# Patient Record
Sex: Male | Born: 1955 | Race: White | Hispanic: No | Marital: Married | Smoking: Former smoker
Health system: Southern US, Community
[De-identification: ages and names within clinical notes are randomized; demographics above are authoritative.]

## PROBLEM LIST (undated history)

## (undated) DIAGNOSIS — J45909 Unspecified asthma, uncomplicated: Secondary | ICD-10-CM

## (undated) DIAGNOSIS — I2510 Atherosclerotic heart disease of native coronary artery without angina pectoris: Secondary | ICD-10-CM

## (undated) DIAGNOSIS — I252 Old myocardial infarction: Secondary | ICD-10-CM

## (undated) DIAGNOSIS — Z9889 Other specified postprocedural states: Secondary | ICD-10-CM

## (undated) DIAGNOSIS — E785 Hyperlipidemia, unspecified: Secondary | ICD-10-CM

## (undated) DIAGNOSIS — I10 Essential (primary) hypertension: Secondary | ICD-10-CM

## (undated) HISTORY — DX: Old myocardial infarction: I25.2

## (undated) HISTORY — DX: Essential (primary) hypertension: I10

## (undated) HISTORY — DX: Atherosclerotic heart disease of native coronary artery without angina pectoris: I25.10

## (undated) HISTORY — DX: Unspecified asthma, uncomplicated: J45.909

## (undated) HISTORY — PX: HERNIA REPAIR: SHX51

## (undated) HISTORY — DX: Hyperlipidemia, unspecified: E78.5

## (undated) HISTORY — DX: Other specified postprocedural states: Z98.890

---

## 2008-05-03 DIAGNOSIS — Z9889 Other specified postprocedural states: Secondary | ICD-10-CM

## 2008-05-03 HISTORY — DX: Other specified postprocedural states: Z98.890

## 2008-05-14 DIAGNOSIS — I252 Old myocardial infarction: Secondary | ICD-10-CM

## 2008-05-14 HISTORY — DX: Old myocardial infarction: I25.2

## 2008-05-14 HISTORY — PX: CORONARY ANGIOPLASTY WITH STENT PLACEMENT: SHX49

## 2008-05-23 ENCOUNTER — Encounter (INDEPENDENT_AMBULATORY_CARE_PROVIDER_SITE_OTHER): Payer: Self-pay | Admitting: Cardiology

## 2008-05-23 ENCOUNTER — Inpatient Hospital Stay (HOSPITAL_COMMUNITY): Admission: EM | Admit: 2008-05-23 | Discharge: 2008-05-28 | Payer: Self-pay | Admitting: Cardiology

## 2008-07-12 ENCOUNTER — Encounter (HOSPITAL_COMMUNITY): Admission: RE | Admit: 2008-07-12 | Discharge: 2008-10-10 | Payer: Self-pay | Admitting: Cardiovascular Disease

## 2008-10-11 ENCOUNTER — Encounter (HOSPITAL_COMMUNITY): Admission: RE | Admit: 2008-10-11 | Discharge: 2008-10-19 | Payer: Self-pay | Admitting: Cardiovascular Disease

## 2009-02-14 ENCOUNTER — Observation Stay (HOSPITAL_COMMUNITY): Admission: EM | Admit: 2009-02-14 | Discharge: 2009-02-15 | Payer: Self-pay | Admitting: Emergency Medicine

## 2010-04-20 LAB — CBC
Hemoglobin: 14.2 g/dL (ref 13.0–17.0)
MCHC: 34.9 g/dL (ref 30.0–36.0)
Platelets: 205 10*3/uL (ref 150–400)
Platelets: 209 10*3/uL (ref 150–400)
RBC: 4.48 MIL/uL (ref 4.22–5.81)
RDW: 12.6 % (ref 11.5–15.5)
RDW: 12.6 % (ref 11.5–15.5)
WBC: 7.2 10*3/uL (ref 4.0–10.5)

## 2010-04-20 LAB — MAGNESIUM: Magnesium: 2.3 mg/dL (ref 1.5–2.5)

## 2010-04-20 LAB — BASIC METABOLIC PANEL
BUN: 19 mg/dL (ref 6–23)
Calcium: 9.6 mg/dL (ref 8.4–10.5)
GFR calc Af Amer: >60 mL/min (ref >60)
GFR calc non Af Amer: >60 mL/min (ref >60)

## 2010-04-20 LAB — COMPREHENSIVE METABOLIC PANEL
Alkaline Phosphatase: 75 U/L (ref 39–117)
CO2: 27 mEq/L (ref 19–32)
Chloride: 102 mEq/L (ref 96–112)
GFR calc non Af Amer: >60 mL/min (ref >60)
Total Protein: 7.1 g/dL (ref 6.0–8.3)

## 2010-04-20 LAB — CARDIAC PANEL(CRET KIN+CKTOT+MB+TROPI)
Relative Index: INVALID (ref 0.0–2.5)
Total CK: 83 U/L (ref 7–232)
Troponin I: 0.04 ng/mL (ref 0.00–0.06)

## 2010-04-20 LAB — CK TOTAL AND CKMB (NOT AT ARMC)
CK, MB: 2.6 ng/mL (ref 0.3–4.0)
Relative Index: 2.1 (ref 0.0–2.5)
Total CK: 126 U/L (ref 7–232)

## 2010-04-20 LAB — DIFFERENTIAL
Eosinophils Relative: 3 % (ref 0–5)
Lymphocytes Relative: 27 % (ref 12–46)
Lymphs Abs: 2 10*3/uL (ref 0.7–4.0)
Monocytes Absolute: 0.7 10*3/uL (ref 0.1–1.0)
Monocytes Relative: 9 % (ref 3–12)

## 2010-04-20 LAB — PROTIME-INR: Prothrombin Time: 12.6 seconds (ref 11.6–15.2)

## 2010-04-20 LAB — APTT: aPTT: 30 seconds (ref 24–37)

## 2010-04-20 LAB — D-DIMER, QUANTITATIVE: D-Dimer, Quant: <0.22 ug/mL-FEU (ref 0.00–0.48)

## 2010-04-20 LAB — TROPONIN I: Troponin I: 0.02 ng/mL (ref 0.00–0.06)

## 2010-05-14 LAB — BASIC METABOLIC PANEL
BUN: 14 mg/dL (ref 6–23)
BUN: 16 mg/dL (ref 6–23)
CO2: 25 mEq/L (ref 19–32)
CO2: 26 mEq/L (ref 19–32)
Calcium: 8.7 mg/dL (ref 8.4–10.5)
Calcium: 8.8 mg/dL (ref 8.4–10.5)
Calcium: 9.5 mg/dL (ref 8.4–10.5)
Calcium: 9.7 mg/dL (ref 8.4–10.5)
Chloride: 104 mEq/L (ref 96–112)
Creatinine, Ser: 0.97 mg/dL (ref 0.4–1.5)
Creatinine, Ser: 1.02 mg/dL (ref 0.4–1.5)
Creatinine, Ser: 1.09 mg/dL (ref 0.4–1.5)
GFR calc Af Amer: >60 mL/min (ref >60)
GFR calc Af Amer: >60 mL/min (ref >60)
GFR calc Af Amer: >60 mL/min (ref >60)
GFR calc non Af Amer: >60 mL/min (ref >60)
GFR calc non Af Amer: >60 mL/min (ref >60)
GFR calc non Af Amer: >60 mL/min (ref >60)
Glucose, Bld: 102 mg/dL — ABNORMAL HIGH (ref 70–99)
Glucose, Bld: 109 mg/dL — ABNORMAL HIGH (ref 70–99)
Glucose, Bld: 144 mg/dL — ABNORMAL HIGH (ref 70–99)
Glucose, Bld: 98 mg/dL (ref 70–99)
Potassium: 3.9 mEq/L (ref 3.5–5.1)
Potassium: 4.2 mEq/L (ref 3.5–5.1)
Sodium: 128 mEq/L — ABNORMAL LOW (ref 135–145)
Sodium: 136 mEq/L (ref 135–145)
Sodium: 137 mEq/L (ref 135–145)

## 2010-05-14 LAB — CARDIAC PANEL(CRET KIN+CKTOT+MB+TROPI)
CK, MB: 16.5 ng/mL — ABNORMAL HIGH (ref 0.3–4.0)
CK, MB: 57.6 ng/mL — ABNORMAL HIGH (ref 0.3–4.0)
Relative Index: 15.5 — ABNORMAL HIGH (ref 0.0–2.5)
Relative Index: 7.9 — ABNORMAL HIGH (ref 0.0–2.5)
Total CK: 1659 U/L — ABNORMAL HIGH (ref 7–232)
Total CK: 998 U/L — ABNORMAL HIGH (ref 7–232)
Troponin I: 17.5 ng/mL (ref 0.00–0.06)

## 2010-05-14 LAB — CBC
HCT: 35.2 % — ABNORMAL LOW (ref 39.0–52.0)
HCT: 35.6 % — ABNORMAL LOW (ref 39.0–52.0)
HCT: 37.5 % — ABNORMAL LOW (ref 39.0–52.0)
HCT: 39.8 % (ref 39.0–52.0)
Hemoglobin: 12.3 g/dL — ABNORMAL LOW (ref 13.0–17.0)
Hemoglobin: 13.9 g/dL (ref 13.0–17.0)
Hemoglobin: 13.9 g/dL (ref 13.0–17.0)
MCHC: 34.9 g/dL (ref 30.0–36.0)
MCHC: 35 g/dL (ref 30.0–36.0)
MCV: 89.7 fL (ref 78.0–100.0)
MCV: 91 fL (ref 78.0–100.0)
MCV: 91.4 fL (ref 78.0–100.0)
Platelets: 183 10*3/uL (ref 150–400)
Platelets: 206 10*3/uL (ref 150–400)
Platelets: 231 10*3/uL (ref 150–400)
RBC: 4.36 MIL/uL (ref 4.22–5.81)
RBC: 4.37 MIL/uL (ref 4.22–5.81)
RDW: 11.9 % (ref 11.5–15.5)
RDW: 12.1 % (ref 11.5–15.5)
RDW: 12.4 % (ref 11.5–15.5)
RDW: 12.6 % (ref 11.5–15.5)
WBC: 16.4 10*3/uL — ABNORMAL HIGH (ref 4.0–10.5)

## 2010-05-14 LAB — LIPID PANEL
LDL Cholesterol: 95 mg/dL (ref 0–99)
Total CHOL/HDL Ratio: 4 RATIO
Triglycerides: 79 mg/dL (ref <150)
VLDL: 16 mg/dL (ref 0–40)

## 2010-05-14 LAB — DIFFERENTIAL
Basophils Absolute: 0 10*3/uL (ref 0.0–0.1)
Eosinophils Absolute: 0.2 10*3/uL (ref 0.0–0.7)
Eosinophils Relative: 1 % (ref 0–5)
Lymphocytes Relative: 9 % — ABNORMAL LOW (ref 12–46)
Lymphs Abs: 1.4 10*3/uL (ref 0.7–4.0)
Monocytes Absolute: 0.9 10*3/uL (ref 0.1–1.0)

## 2010-05-14 LAB — COMPREHENSIVE METABOLIC PANEL
CO2: 20 mEq/L (ref 19–32)
Calcium: 7.9 mg/dL — ABNORMAL LOW (ref 8.4–10.5)
Chloride: 102 mEq/L (ref 96–112)
Creatinine, Ser: 1.1 mg/dL (ref 0.4–1.5)
Sodium: 130 mEq/L — ABNORMAL LOW (ref 135–145)
Total Bilirubin: 0.5 mg/dL (ref 0.3–1.2)

## 2010-05-14 LAB — POCT I-STAT, CHEM 8
BUN: 19 mg/dL (ref 6–23)
Chloride: 106 mEq/L (ref 96–112)
Creatinine, Ser: 1 mg/dL (ref 0.4–1.5)
Glucose, Bld: 162 mg/dL — ABNORMAL HIGH (ref 70–99)

## 2010-05-14 LAB — CK TOTAL AND CKMB (NOT AT ARMC)
CK, MB: 13.4 ng/mL — ABNORMAL HIGH (ref 0.3–4.0)
Relative Index: 7.4 — ABNORMAL HIGH (ref 0.0–2.5)
Total CK: 181 U/L (ref 7–232)

## 2010-05-14 LAB — GLUCOSE, CAPILLARY: Glucose-Capillary: 130 mg/dL — ABNORMAL HIGH (ref 70–99)

## 2010-05-14 LAB — PLATELET COUNT: Platelets: 233 10*3/uL (ref 150–400)

## 2010-05-14 LAB — PROTIME-INR: INR: 6.4 (ref 0.00–1.49)

## 2010-06-17 NOTE — Cardiovascular Report (Signed)
[name], [name] NO.:  [id number]   MEDICAL RECORD NO.:  [id number]          PATIENT TYPE:  INP   LOCATION:  2807                         FACILITY:  [facility]   PHYSICIAN:  Scott Hilts. Jacinto Halim, MD       DATE OF BIRTH:  [birth year]   DATE OF PROCEDURE:  05/23/2008  DATE OF DISCHARGE:                            CARDIAC CATHETERIZATION   PROCEDURES PERFORMED:  1. Left heart catheterization including left ventriculography.  2. Selective right and left coronary arteriography.  3. Percutaneous transluminal coronary angioplasty and stenting of the      proximal left anterior descending.  4. Percutaneous transluminal coronary angioplasty and balloon      angioplasty of the diagonal branch of the left anterior descending.  5. Export thrombectomy into the mid left anterior descending.  6. Placement of intra-aortic balloon pump.  7. Defibrillation for cardiogenic shock and ventricular fibrillation      arrest.   SURGEON:  Scott Kotyk R. Jacinto Halim, MD   ASSISTANT:  Scott Batty, MD   INDICATIONS:  [name] is a 55 year old gentleman with  known hypertension who was doing well until early this morning developed  severe chest pain.  He was rushed to the emergency room and there he was  found to have ST-segment elevation myocardial infarction.  The anterior  leads suggested of anterior wall myocardial infarct.  He was also  hypotensive and in cardiogenic shock.  He was emergently rushed to the  cardiac catheter lab to evaluate his coronary anatomy.   HEMODYNAMIC DATA:  The left ventricular pressure was 68/9 with an end-  diastolic pressure of 19 mmHg.  Aortic pressure was 73/41 with a mean of  60 mmHg.  There was no pressure gradient across the aortic valve.   ANGIOGRAPHIC DATA:  Left ventricular systolic function appeared to be  mildly depressed with a mid-to-distal anteroapical hypoakinesis.   CONTRAST:  Minimal contrast was utilized because of renal insufficiency.   Right coronary artery:  Right coronary artery is codominant with  circumflex coronary artery.  It has got mild luminal irregularity.   Left main coronary artery:  The left main coronary artery is a large  caliber vessel.  It is smooth and normal.   Circumflex coronary artery:  Circumflex coronary artery is codominant  with right coronary artery.  Smooth and normal.   Ramus intermediate:  Ramus intermediate is a small caliber vessel which  is smooth and normal.   LAD:  LAD is a large caliber vessel.  There is a thrombotic 99% stenosis  noted in the proximal LAD with TIMI II flow.  Diagonal 1 branch which is  very large has an ostial 60% stenoses and also was hazy.   INTERVENTION DATA:  1. Successful PTCA and stenting of the proximal LAD with implantation      of a 3.5 x 28 mm Xience stent deployed at 10 atmospheric pressure      postdilated with a 3.5 x 12 mm Voyager [place] at 16 atmospheric      pressure with overall reduction of stenosis from 99% to 0%  and      improving the TIMI flow from TIMI II to TIMI III at the end of the      procedure.  There was much minimal amount of residual thrombus      noted in the distal LAD for which the patient underwent export      catheter thrombectomy and was left alone as it was very minimal      thrombus burden.  Bailout Integrillin was also utilized.  2. Successful PTCA and balloon angioplasty of the diagonal branch of      the LAD.  A 2.0 x 12 mm apex balloon was utilized and the stenosis      was reduced from 99% to less than 30% with brisk TIMI III to TIMI      III flow maintained at the end of the procedure.  3. Significant spasm was noted throughout the LAD with introduction of      the guidewires.  Intracoronary adenosine and nitroglycerin was also      administered.  4. The patient was defibrillated x2 for V-fib arrest.  Intra-aortic      balloon pump was utilized prior to the PCI for cardiogenic shock.   RECOMMENDATIONS:  The patient  will be continued on aggressive risk  modification.  He is presently on 5 mcg/kg/min of dopamine which will be  weaned off once his pressure is stabilized.  We will continue intra-  aortic balloon pump for 18-24 hours.  We will continue with Effient and  start him on low dose of beta-blocker and ACE inhibitors as the blood  pressure tolerates.  His serum creatinine needs to be followed closely.  His serum creatinine at baseline was 1.4.   TECHNIQUE OF PROCEDURE:  Under usual sterile precautions using a 6-  French right femoral arterial access, a 6-French Judkins left diagnostic  catheter was utilized to engage the left main coronary and angiography  was performed.   TECHNIQUE OF INTERVENTION:  The initial access was obtained by Dr.  Nanetta Rush and diagnostic angiography of the left initially was  performed by Dr. Nanetta Rush and I took over the case.  I thank him  for assisting me in starting up the case as I was on-call.   A 6-French FL-4 guide catheter was utilized to engage the left main  coronary artery.  While this was performed, Angiomax was going on.  I  accessed his left groin femoral artery and intra-aortic balloon pump was  placed.  Using Angiomax for anticoagulation, a Asahi Prowater guidewire  was advanced into the LAD.  Significant spasm was noted.  A 2.5 x 15 mm  Voyager balloon was utilized and balloon angioplasty at 16 atmospheric  pressure was performed with establishment of TIMI III flow.  We decided  to stent this with 3.5 x 28 mm Xience stent into the ostium of the LAD  followed by post-dilatation with a 3.5 x 12 mm Voyager  at 16  atmospheric pressure x2 into the LAD.  Having performed this, excellent  results with a 0% residual stenosis was noted with brisk TIMI III flow.  Because of significant stent jailing of the large diagonal 1 branch of  the LAD, I utilized the same wire and a 2.0 x 15 mm apex balloon was  utilized for balloon angioplasty with the  diagonal branch.  Multiple  balloon inflations were performed at the peak of 10 atmospheric pressure  for 1 minute each.  Having performed this, angiography revealed  excellent results.  The guidewire was withdrawn and because of presence  of minimal amount of thrombus at the distal end of the proximal LAD  stent, I re-advanced the guidewire back and export catheter was utilized  and a thrombectomy was performed.  Significant spasm was again noted.  I  gave intracoronary nitroglycerin and gently withdrew the guidewire back  and waited for 5 minutes and angiography revealed excellent brisk flow  without any distal dissection.  Minimal amount of thrombus was noted,  however, I felt comfortable that this would be washed out or dissolved  with the Integrillin and Effient and aspirin on-board along with the  continued intra-aortic balloon pump support.  Overall, the patient  tolerated the procedure well.  At the end of the procedure, his systolic  blood pressure had stabilized around 125 mmHg.   The guidewire and guide catheter was disengaged and pulled out of the  body and a 6-French Judkins right 4 diagnostic catheter was utilized to  engage the right coronary artery and angiography was performed.  The  catheter was then pulled out of the body and a 6-French angled pigtail  catheter was utilized to perform left ventriculography in RAO  projection.  Catheter was then pulled out of the body in the usual  fashion.  The patient tolerated the procedure well.  No immediate  complications were noted.      Scott Hilts. Jacinto Halim, MD  Electronically Signed     JRG/MEDQ  D:  05/23/2008  T:  05/23/2008  Job:  [id number]

## 2010-06-17 NOTE — H&P (Signed)
NAMEKC, SUMMERSON NO.:  [id number]   MEDICAL RECORD NO.:  [id number]          PATIENT TYPE:  AMB   LOCATION:  CATH                         FACILITY:  [facility]   PHYSICIAN:  Nanetta Batty, M.D.   DATE OF BIRTH:  04/28/1955   DATE OF ADMISSION:  05/23/2008  DATE OF DISCHARGE:                              HISTORY & PHYSICAL   CHIEF COMPLAINTS:  Chest pain.   HISTORY OF PRESENT ILLNESS:  The patient is a 55 year old male with no  prior history of coronary disease.  He is followed by Dr. Dareen Piano in  [place].  He has hypertension and dyslipidemia.  He is a smoker.  Apparently this morning he was at work at [employer] about 5:45 when he  developed sudden substernal chest pain, became pale and diaphoretic.  EMS was contacted.  His initial EKG showed ST elevation in V1 and V2  with ST depression in the inferior leads.  The patient was treated as a  ST elevation MI and transported urgently to [facility] cath lab directly.  En  route he became hypotensive and required IV fluids in Trendelenburg.  He  is awake, alert and oriented on arrival to the cath lab but complaining  of fairly severe substernal chest pain.  His blood pressure initially on  arrival at cath lab was 90 systolic.  It is now 120 systolic.   PAST MEDICAL HISTORY:  His past medical history is remarkable only for  treated hypertension and dyslipidemia.  He says he may have had a remote  ulcer.  He has had a prior hernia repair.   CURRENT MEDICATIONS:  His current medications include lisinopril, a  cholesterol medicine and Claritin.   ALLERGIES:  He is allergic to PENICILLIN.   SOCIAL HISTORY:  He is married, he does smoke.  Other details are  unavailable.   FAMILY HISTORY:  Family history is remarkable in that his grandmother  died apparently during some type of catheterization.  There is no  immediate family history of coronary disease.   REVIEW OF SYSTEMS:  Review of systems otherwise essentially  unremarkable  except for noted above.   PHYSICAL EXAMINATION:  VITAL SIGNS:  Blood pressure currently is 123/75,  pulse 80, respirations 18.  GENERAL:  He is a well-developed, well-nourished, anxious, pale male  complaining of substernal chest pain and moaning.  HEENT: Normocephalic aortic.  He wears glasses.  NECK:  Without JVD.  CHEST:  Chest is clear to auscultation and percussion.  CARDIAC:  Reveals regular rate and rhythm.  ABDOMEN:  Not distended, nontender.  EXTREMITIES:  Reveal no edema.  Distal pulses are diminished.  NEURO:  Exam is grossly intact.  He is awake, alert and oriented,  cooperative.  SKIN: Cool and dry currently.   IMPRESSION:  1. ST elevation myocardial infarction.  2. History of hypertension, currently hypotensive.  3. History of dyslipidemia.  4. History of smoking.  5. History of penicillin allergy.   PLAN:  The patient was taken urgently to the cath lab where he will be  cathed by Dr. Allyson Sabal.  Abelino Derrick, P.A.      Nanetta Batty, M.D.  Electronically Signed    LKK/MEDQ  D:  05/23/2008  T:  05/23/2008  Job:  [id number]

## 2010-06-17 NOTE — Discharge Summary (Signed)
[name], [name]                 ACCOUNT NO.:  [id number]   MEDICAL RECORD NO.:  [id number]          PATIENT TYPE:  INP   LOCATION:  3709                         FACILITY:  [facility]   PHYSICIAN:  Scott Hilts. Jacinto Halim, MD       DATE OF BIRTH:  May 09, 1955   DATE OF ADMISSION:  05/23/2008  DATE OF DISCHARGE:  05/28/2008                               DISCHARGE SUMMARY   DISCHARGE DIAGNOSES:  1. Acute ST elevation myocardial infarction, anterior.      a.     99% left anterior descending stenosis undergoing rescue       percutaneous transluminal coronary angioplasty and stent       deployment with a drug-eluting XIENCE stent.  2. Left ventricular dysfunction, ejection fraction 45%.  3. Ventricular fibrillation arrest x2 with procedure.  4. Acute left ventricular dysfunction with intra-aortic balloon pump      placement.  Discontinued after 24 hours.  5. Hypertension, controlled.      a.     Hypotensive with medication adjustment.  6. Dyslipidemia with change in medication.  7. Discharge condition improved.   PROCEDURES:  Combined left heart catheterization, May 23, 2008, by Dr.  Allyson Rush and Dr. Jacinto Rush with placement of XIENCE stent to the left anterior  descending emergently   DISCHARGE MEDICATIONS:  1. Effient 10 mg daily, do not stop.  2. Increase aspirin to 325 mg daily.  3. Inspra 25 mg daily.  4. Coreg 3.125 mg one twice a day.  5. Lisinopril new dose 5 mg, take only half a tablet to equal 2.5 mg      daily.  6. Protonix 40 mg daily.  7. Centrum Silver daily.  8. Tylenol Arthritis daily.  9. Crestor 20 mg one daily.  10.Zocor.  11.Neo-Synephrine nasal spray 0.25% 1-2 puffs every 4 hours for nasal      congestion as needed.  12.Nitroglycerin sublingual 1/150 for chest pain one every 5 minutes      while sitting up to total of 3/15 minutes.  13.Claritin.   DISCHARGE INSTRUCTIONS:  1. No work until after he see Dr. Allyson Rush.  2. Low-sodium heart-healthy diet.  3. Wash cath site with  soap and water.  Call if any bleeding,      swelling, or drainage both areas with balloon pump as well as      cardiac cath.  4. Increase activity slowly.  May shower.  No lifting for 2 weeks.  No      driving for 1 week.  5. Follow up with Dr. Allyson Rush in Jun 06, 2008 at 9:15 a.m. of      [facility].   HISTORY OF PRESENT ILLNESS:  A 55 year old white married male with no  prior history of coronary artery disease and is followed by Dr. Dareen Rush  at [facility] for hypertension and dyslipidemia and tobacco use, was  at work at [employer] at 5:45 when he developed sudden substernal chest  pain, became pale and diaphoretic.  EMS was called.  He was treated as  ST elevation MI, transported urgently to [facility]  Cath Lab directly.  He became hypotensive and requiring IV fluids in Trendelenburg.  He was  awake and alert on arrival to the cath lab, but has severe substernal  chest pain, pressure improved to 120 initially.  The patient had to VFib  arrest and was shocked into sinus rhythm.  Cardiac cath was done with  99% LAD stenosis, undergoing PTCA and stent deployment.   PAST MEDICAL HISTORY:  1. Hypertension and dyslipidemia.  2. Remote ulcer and prior hernia surgery.   OUTPATIENT MEDS:  Lisinopril, Zocor and Claritin.   ALLERGIES:  PENICILLIN.   FAMILY HISTORY, SOCIAL HISTORY, REVIEW OF SYSTEMS:  See H and P.   PHYSICAL EXAMINATION AT DISCHARGE:  Blood pressure 101/68, pulse 75,  sinus rhythm.  Noticed chest pain or shortness of breath.  Heart, S1,  S2, regular rate and rhythm.  Lungs are clear.  Cath sites were stable.   LABORATORY DATA:  Admitting hemoglobin 12.4, hematocrit 35.6, WBC 16.3,  platelets 206, neutrophils 85.  These remained stable at discharge,  hemoglobin 13.9, hematocrit 39.8, WBC 10.3, platelets 264.   Chemistry on admission; sodium 130, potassium 3.6, PO2 20, BUN 19,  creatinine 1.10, glucose 147.  Prior to discharge, sodium 137, potassium   3.9, chloride 104, CO2 26, BUN 12, creatinine 0.97, glucose 109.   Initial INR was 6.4 with a protime of 62.5 and PTT was 68, which was  felt to be at lab error as the patient was not on Coumadin.  He were in  other meds.  So, the initial protime INR was felt to be inaccurate, and  heparin he was therapeutic though later in the day.   AST 24, ALT 29, alkaline phos 78, total bili 0.5, and albumin 2.9.  Cardiac enzymes; initial CK 181, MB of 13.4 with a troponin of 0.42 with  increase to peak CK was 1673, MB of 185, peak troponin was 18.83 and  prior to discharge on May 25, 2008 the last, CK was 596, MB 16.5, and  troponin I is 7.69.   Total cholesterol 148, LDL 95, HDL 37, triglycerides 79, calcium 7.9 on  admission, but 9.0 on recheck, magnesium 2.4.   Lung volumes, mild edema.  Intra-aortic balloon pump was present and  followup chest x-ray, central venous pulmonary congestion improved.  No  evidence of focal infiltrate.   EKG; initial revealed ST elevations in leads V1, V2, V3 with reciprocal  changes in leads 2, 3, and aVF.  Postcath showed normal sinus rhythm,  normal EKG, minimal reciprocal changes in his inferior leads, but ST  depression was resolved.  Followup on May 24, 2008, he had  evolutionary changes of anterior MI with T-wave inversions V2, V3, and  V4.  On May 25, 2008, again sinus rhythm, normal EKG.   A 2-D echo on the right was done on May 23, 2008, LV systolic function  was 45%, moderate hypokinesis of the mid distal myocardium in the  distribution of the LAD, severe hypokinesis of the entire apical  myocardium in the LAD.   HOSPITAL COURSE:  The patient was admitted emergently from his place of  work at [employer] secondary to acute ST elevation MI and was transported to  the cath lab.  He was became hypotensive in route and fluids were given.  Dopamine was started in the cath lab.  The patient did have VFib x2 with  shocks twice as well, underwent PTCA and  stent deployment to the LAD for  99% stenosis with  improvement.  Because of the severity of the MI and  his hypotension, intra-aortic balloon pump was also placed.   Please note, his INR initially showed 6.4, this was felt to be lab  error, it was rechecked and was just slightly above normal.   By the May 24, 2008, he was doing well without chest pain or shortness  of breath.  Medications were slightly adjusted.  He continued to  improve.  Balloon pump was discontinued and the patient soon thereafter  was able to be up in the chair, tolerate being up, sit for one episode  when his blood pressure dropped somewhat after his dopamine was  discontinued and he was given a fluid bolus with improvement of his  symptoms, had some mild hyponatremia.  He continued to do well.  By  May 27, 2008, blood pressure was up and down, medications were  adjusted and on May 28, 2008, blood pressure was stable.  It was felt  he was ready for discharge home.  He will follow up in 1 week with Dr.  Allyson Rush for further interactions with medications.  He was given coupon  for his Effient and they know to call if there are any questions  concerning his care.  Please note, he was instructed to stop smoking  during the cardiac rehab as well as to the physician.      Darcella Gasman. [place], N.P.      Scott Hilts. Jacinto Halim, MD  Electronically Signed    LRI/MEDQ  D:  05/28/2008  T:  05/29/2008  Job:  [id number]   cc:   Nanetta Batty, M.D.  Dr. Dareen Rush

## 2012-07-15 ENCOUNTER — Other Ambulatory Visit: Payer: Self-pay | Admitting: *Deleted

## 2012-07-15 MED ORDER — NITROGLYCERIN 0.4 MG SL SUBL: 0.4000 mg | SUBLINGUAL_TABLET | SUBLINGUAL | Status: DC | PRN

## 2012-08-29 ENCOUNTER — Encounter: Payer: Self-pay | Admitting: Cardiology

## 2012-08-29 DIAGNOSIS — I252 Old myocardial infarction: Secondary | ICD-10-CM

## 2012-08-29 DIAGNOSIS — I2510 Atherosclerotic heart disease of native coronary artery without angina pectoris: Secondary | ICD-10-CM

## 2012-08-29 DIAGNOSIS — I10 Essential (primary) hypertension: Secondary | ICD-10-CM

## 2012-08-29 DIAGNOSIS — E785 Hyperlipidemia, unspecified: Secondary | ICD-10-CM

## 2012-08-29 DIAGNOSIS — Z9889 Other specified postprocedural states: Secondary | ICD-10-CM

## 2012-08-31 ENCOUNTER — Encounter: Payer: Self-pay | Admitting: Cardiovascular Disease

## 2012-09-01 ENCOUNTER — Ambulatory Visit (INDEPENDENT_AMBULATORY_CARE_PROVIDER_SITE_OTHER): Payer: Managed Care, Other (non HMO) | Admitting: Cardiovascular Disease

## 2012-09-01 ENCOUNTER — Encounter: Payer: Self-pay | Admitting: Cardiovascular Disease

## 2012-09-01 VITALS — BP 110/82 | HR 59 | Ht 68.5 in | Wt 191.0 lb

## 2012-09-01 DIAGNOSIS — E785 Hyperlipidemia, unspecified: Secondary | ICD-10-CM

## 2012-09-01 DIAGNOSIS — Z79899 Other long term (current) drug therapy: Secondary | ICD-10-CM

## 2012-09-01 DIAGNOSIS — I2510 Atherosclerotic heart disease of native coronary artery without angina pectoris: Secondary | ICD-10-CM

## 2012-09-01 LAB — HEPATIC FUNCTION PANEL
AST: 20 U/L (ref 0–37)
Alkaline Phosphatase: 81 U/L (ref 39–117)
Bilirubin, Direct: 0.2 mg/dL (ref 0.0–0.3)
Indirect Bilirubin: 0.5 mg/dL (ref 0.0–0.9)
Total Bilirubin: 0.7 mg/dL (ref 0.3–1.2)

## 2012-09-01 LAB — LIPID PANEL: Total CHOL/HDL Ratio: 3 Ratio

## 2012-09-01 NOTE — Progress Notes (Signed)
09/01/2012 [name]   11/18/1955  [id number]  Primary Physician No PCP Per Patient Primary Cardiologist: [name] [name]   HPI:  The patient is a very pleasant 57 year old, mildly overweight, married Caucasian male, father of 3 living children (1 son was killed in [name] July 22, 2004, by a sniper,) grandfather to 4 grandchildren and 4 step-grandchildren who I last saw in the office May 19, 2010. He suffered an anterior wall myocardial infarction May 14, 2008, complicated by ventricular fibrillation requiring defibrillation. He had cardiogenic shock requiring intraaortic balloon pumping. He was stented with a 3.5 x 20 mm long Xience drug-eluting stent. His EF at that time was 45% with anteroapical wall motion abnormality. Since that time his diet has changed, he stopped smoking and he exercises. His EF improved and followup Myoview performed several months later showed significant salvage of myocardium with no evidence of scar. His last lipid profile performed10/5/13 regular cholesterol of 122, LDL 69 and HDL of 36. He denies chest pain or shortness of breath.     Current Outpatient Prescriptions  Medication Sig Dispense Refill  . acetaminophen (TYLENOL) 650 MG CR tablet Take 650 mg by mouth every 8 (eight) hours as needed for pain.      Marland Kitchen acyclovir (ZOVIRAX) 400 MG tablet Take 400 mg by mouth 3 (three) times daily as needed.      Marland Kitchen albuterol (PROVENTIL HFA;VENTOLIN HFA) 108 (90 BASE) MCG/ACT inhaler Inhale 2 puffs into the lungs every 6 (six) hours as needed for wheezing.      Marland Kitchen aspirin 325 MG tablet Take 325 mg by mouth daily.      Marland Kitchen atorvastatin (LIPITOR) 40 MG tablet Take 40 mg by mouth daily.      . carvedilol (COREG) 3.125 MG tablet Take 3.125 mg by mouth 2 (two) times daily with a meal.      . cetirizine (ZYRTEC) 10 MG tablet Take 10 mg by mouth daily.      . clopidogrel (PLAVIX) 75 MG tablet Take 75 mg by mouth daily.      Marland Kitchen eplerenone (INSPRA) 25 MG  tablet Take 25 mg by mouth daily.      Marland Kitchen lisinopril (PRINIVIL,ZESTRIL) 2.5 MG tablet Take 2.5 mg by mouth daily.      . Multiple Vitamin (MULTIVITAMIN) tablet Take 1 tablet by mouth daily.      . nitroGLYCERIN (NITROSTAT) 0.4 MG SL tablet Place 1 tablet (0.4 mg total) under the tongue every 5 (five) minutes as needed for chest pain.  25 tablet  1  . pantoprazole (PROTONIX) 40 MG tablet Take 40 mg by mouth daily.       No current facility-administered medications for this visit.    Allergies  Allergen Reactions  . Penicillins     History   Social History  . Marital Status: Single    Spouse Name: N/A    Number of Children: N/A  . Years of Education: N/A   Occupational History  . Not on file.   Social History Main Topics  . Smoking status: Former Smoker    Quit date: 05/23/2008  . Smokeless tobacco: Not on file  . Alcohol Use: Not on file  . Drug Use: Not on file  . Sexually Active: Not on file   Other Topics Concern  . Not on file   Social History Narrative  . No narrative on file     Review of Systems: General: negative for chills, fever, night sweats or  weight changes.  Cardiovascular: negative for chest pain, dyspnea on exertion, edema, orthopnea, palpitations, paroxysmal nocturnal dyspnea or shortness of breath Dermatological: negative for rash Respiratory: negative for cough or wheezing Urologic: negative for hematuria Abdominal: negative for nausea, vomiting, diarrhea, bright red blood per rectum, melena, or hematemesis Neurologic: negative for visual changes, syncope, or dizziness All other systems reviewed and are otherwise negative except as noted above.    Blood pressure 110/82, pulse 59, height 5' 8.5" (1.74 m), weight 191 lb (86.637 kg).  General appearance: alert and no distress Neck: no adenopathy, no carotid bruit, no JVD, supple, symmetrical, trachea midline and thyroid not enlarged, symmetric, no tenderness/mass/nodules Lungs: clear to  auscultation bilaterally Heart: regular rate and rhythm, S1, S2 normal, no murmur, click, rub or gallop Extremities: extremities normal, atraumatic, no cyanosis or edema  EKG sinus bradycardia at 59 without ST or T wave changes  ASSESSMENT AND PLAN:   CAD (coronary artery disease), native coronary artery Status post trauma cranial function 05/14/2008 complicated by ventricular fibrillation requiring defibrillation. He had cardiogenic shock requiring intra-aortic balloon pumping. He was stented with a 3.5 x 20 mm long drug-eluting stent. His EF at that time was 45% with anterolateral apical wall motion and a malady which has subsequently improved by 2-D echo. In my view also showed no evidence of scar and no ischemia on 07/27/08. He did stop smoking and change his diet. He also exercises. He denies chest pain or shortness of breath.  Hyperlipidemia LDL goal <70 On statin drugs with his last lipid profile performed 11/08/11 revealed a total cholesterol of 122, LDL 69 and HDL of 36      [name] [facility], [facility] 09/01/2012 10:18 AM

## 2012-09-01 NOTE — Patient Instructions (Addendum)
Your physician wants you to follow-up in:  12 months.  You will receive a reminder letter in the mail two months in advance. If you don't receive a letter, please call our office to schedule the follow-up appointment.   

## 2012-09-01 NOTE — Assessment & Plan Note (Signed)
Status post trauma cranial function 05/14/2008 complicated by ventricular fibrillation requiring defibrillation. He had cardiogenic shock requiring intra-aortic balloon pumping. He was stented with a 3.5 x 20 mm long drug-eluting stent. His EF at that time was 45% with anterolateral apical wall motion and a malady which has subsequently improved by 2-D echo. In my view also showed no evidence of scar and no ischemia on 07/27/08. He did stop smoking and change his diet. He also exercises. He denies chest pain or shortness of breath.

## 2012-09-01 NOTE — Assessment & Plan Note (Signed)
On statin drugs with his last lipid profile performed 11/08/11 revealed a total cholesterol of 122, LDL 69 and HDL of 36

## 2012-09-05 ENCOUNTER — Encounter: Payer: Self-pay | Admitting: *Deleted

## 2012-10-24 ENCOUNTER — Other Ambulatory Visit: Payer: Self-pay | Admitting: *Deleted

## 2012-10-24 MED ORDER — PANTOPRAZOLE SODIUM 40 MG PO TBEC: 40.0000 mg | DELAYED_RELEASE_TABLET | Freq: Every day | ORAL | Status: DC

## 2012-10-24 MED ORDER — CARVEDILOL 3.125 MG PO TABS: 3.1250 mg | ORAL_TABLET | Freq: Two times a day (BID) | ORAL | Status: DC

## 2012-10-24 MED ORDER — EPLERENONE 25 MG PO TABS: 25.0000 mg | ORAL_TABLET | Freq: Every day | ORAL | Status: DC

## 2012-10-24 MED ORDER — LISINOPRIL 2.5 MG PO TABS: 2.5000 mg | ORAL_TABLET | Freq: Every day | ORAL | Status: DC

## 2012-10-24 MED ORDER — ATORVASTATIN CALCIUM 40 MG PO TABS: 40.0000 mg | ORAL_TABLET | Freq: Every day | ORAL | Status: DC

## 2012-10-24 MED ORDER — CLOPIDOGREL BISULFATE 75 MG PO TABS: 75.0000 mg | ORAL_TABLET | Freq: Every day | ORAL | Status: DC

## 2012-10-31 NOTE — Telephone Encounter (Signed)
Called [facility]: Inspra 25mg ,Coreg 3.125mg , Lisinopril 2.5mg , and Atorvastatin 40mg  have all been filled and shipped out.

## 2013-01-19 ENCOUNTER — Other Ambulatory Visit: Payer: Self-pay

## 2013-01-19 NOTE — Telephone Encounter (Signed)
Rx refused

## 2013-02-24 ENCOUNTER — Emergency Department (HOSPITAL_COMMUNITY): Payer: BC Managed Care – PPO

## 2013-02-24 ENCOUNTER — Encounter (HOSPITAL_COMMUNITY): Payer: Self-pay | Admitting: Emergency Medicine

## 2013-02-24 ENCOUNTER — Observation Stay (HOSPITAL_COMMUNITY)
Admission: EM | Admit: 2013-02-24 | Discharge: 2013-02-25 | Disposition: A | Discharge status: Home / Self Care | Payer: BC Managed Care – PPO | Attending: Cardiology | Admitting: Cardiology

## 2013-02-24 DIAGNOSIS — I2510 Atherosclerotic heart disease of native coronary artery without angina pectoris: Secondary | ICD-10-CM | POA: Insufficient documentation

## 2013-02-24 DIAGNOSIS — R0789 Other chest pain: Principal | ICD-10-CM | POA: Insufficient documentation

## 2013-02-24 DIAGNOSIS — Z7982 Long term (current) use of aspirin: Secondary | ICD-10-CM | POA: Insufficient documentation

## 2013-02-24 DIAGNOSIS — I252 Old myocardial infarction: Secondary | ICD-10-CM

## 2013-02-24 DIAGNOSIS — E785 Hyperlipidemia, unspecified: Secondary | ICD-10-CM | POA: Insufficient documentation

## 2013-02-24 DIAGNOSIS — Z9861 Coronary angioplasty status: Secondary | ICD-10-CM | POA: Insufficient documentation

## 2013-02-24 DIAGNOSIS — Z7902 Long term (current) use of antithrombotics/antiplatelets: Secondary | ICD-10-CM | POA: Insufficient documentation

## 2013-02-24 DIAGNOSIS — I10 Essential (primary) hypertension: Secondary | ICD-10-CM | POA: Insufficient documentation

## 2013-02-24 DIAGNOSIS — Z880 Allergy status to penicillin: Secondary | ICD-10-CM | POA: Insufficient documentation

## 2013-02-24 DIAGNOSIS — J45909 Unspecified asthma, uncomplicated: Secondary | ICD-10-CM | POA: Insufficient documentation

## 2013-02-24 DIAGNOSIS — R079 Chest pain, unspecified: Secondary | ICD-10-CM

## 2013-02-24 DIAGNOSIS — Z79899 Other long term (current) drug therapy: Secondary | ICD-10-CM | POA: Insufficient documentation

## 2013-02-24 DIAGNOSIS — Z87891 Personal history of nicotine dependence: Secondary | ICD-10-CM | POA: Insufficient documentation

## 2013-02-24 LAB — BASIC METABOLIC PANEL
BUN: 14 mg/dL (ref 6–23)
BUN: 14 mg/dL (ref 6–23)
CALCIUM: 10 mg/dL (ref 8.4–10.5)
CO2: 24 mEq/L (ref 19–32)
CO2: 24 mEq/L (ref 19–32)
Calcium: 10 mg/dL (ref 8.4–10.5)
Chloride: 102 mEq/L (ref 96–112)
Chloride: 105 mEq/L (ref 96–112)
Creatinine, Ser: 0.98 mg/dL (ref 0.50–1.35)
Creatinine, Ser: 0.84 mg/dL (ref 0.50–1.35)
GFR calc Af Amer: >90 mL/min (ref >90)
GFR calc non Af Amer: 90 mL/min — ABNORMAL LOW (ref >90)
GLUCOSE: 102 mg/dL — AB (ref 70–99)
GLUCOSE: 91 mg/dL (ref 70–99)
POTASSIUM: 4 meq/L (ref 3.7–5.3)
POTASSIUM: 4.1 meq/L (ref 3.7–5.3)
Sodium: 141 mEq/L (ref 137–147)
Sodium: 144 mEq/L (ref 137–147)

## 2013-02-24 LAB — CBC
HEMATOCRIT: 41.8 % (ref 39.0–52.0)
HEMOGLOBIN: 14.9 g/dL (ref 13.0–17.0)
MCH: 31 pg (ref 26.0–34.0)
MCHC: 35.6 g/dL (ref 30.0–36.0)
MCV: 86.9 fL (ref 78.0–100.0)
Platelets: 257 10*3/uL (ref 150–400)
RBC: 4.81 MIL/uL (ref 4.22–5.81)
RDW: 12.4 % (ref 11.5–15.5)
WBC: 7.5 10*3/uL (ref 4.0–10.5)

## 2013-02-24 LAB — CBC WITH DIFFERENTIAL/PLATELET
Basophils Absolute: 0 10*3/uL (ref 0.0–0.1)
Basophils Relative: 1 % (ref 0–1)
EOS ABS: 0.2 10*3/uL (ref 0.0–0.7)
EOS PCT: 2 % (ref 0–5)
HCT: 39.8 % (ref 39.0–52.0)
Hemoglobin: 14 g/dL (ref 13.0–17.0)
Lymphocytes Relative: 27 % (ref 12–46)
Lymphs Abs: 2.2 10*3/uL (ref 0.7–4.0)
MCH: 30.7 pg (ref 26.0–34.0)
MCHC: 35.2 g/dL (ref 30.0–36.0)
MCV: 87.3 fL (ref 78.0–100.0)
Monocytes Absolute: 0.6 10*3/uL (ref 0.1–1.0)
Monocytes Relative: 7 % (ref 3–12)
NEUTROS PCT: 63 % (ref 43–77)
Neutro Abs: 5 10*3/uL (ref 1.7–7.7)
PLATELETS: 261 10*3/uL (ref 150–400)
RBC: 4.56 MIL/uL (ref 4.22–5.81)
RDW: 12.5 % (ref 11.5–15.5)
WBC: 8 10*3/uL (ref 4.0–10.5)

## 2013-02-24 LAB — APTT: aPTT: 30 seconds (ref 24–37)

## 2013-02-24 LAB — TROPONIN I
Troponin I: <0.3 ng/mL (ref <0.30)
Troponin I: <0.3 ng/mL (ref <0.30)

## 2013-02-24 LAB — TSH: TSH: 1.22 u[IU]/mL (ref 0.350–4.500)

## 2013-02-24 LAB — PROTIME-INR
INR: 0.98 (ref 0.00–1.49)
PROTHROMBIN TIME: 12.8 s (ref 11.6–15.2)

## 2013-02-24 LAB — POCT I-STAT TROPONIN I: Troponin i, poc: 0 ng/mL (ref 0.00–0.08)

## 2013-02-24 LAB — PRO B NATRIURETIC PEPTIDE: Pro B Natriuretic peptide (BNP): 25.9 pg/mL (ref 0–125)

## 2013-02-24 MED ORDER — PANTOPRAZOLE SODIUM 40 MG PO TBEC
40.0000 mg | DELAYED_RELEASE_TABLET | Freq: Every day | ORAL | Status: DC
  Administered 2013-02-25: 40 mg via ORAL
  Filled 2013-02-24: qty 1

## 2013-02-24 MED ORDER — NITROGLYCERIN 0.4 MG SL SUBL: 0.4000 mg | SUBLINGUAL_TABLET | SUBLINGUAL | Status: DC | PRN

## 2013-02-24 MED ORDER — SODIUM CHLORIDE 0.9 % IJ SOLN
3.0000 mL | Freq: Two times a day (BID) | INTRAMUSCULAR | Status: DC
  Administered 2013-02-24: 3 mL via INTRAVENOUS

## 2013-02-24 MED ORDER — SODIUM CHLORIDE 0.9 % IV SOLN: 250.0000 mL | INTRAVENOUS | Status: DC | PRN

## 2013-02-24 MED ORDER — CARVEDILOL 3.125 MG PO TABS
3.1250 mg | ORAL_TABLET | Freq: Two times a day (BID) | ORAL | Status: DC
  Administered 2013-02-24 – 2013-02-25 (×2): 3.125 mg via ORAL
  Filled 2013-02-24 (×4): qty 1

## 2013-02-24 MED ORDER — ONDANSETRON HCL 4 MG/2ML IJ SOLN: 4.0000 mg | Freq: Four times a day (QID) | INTRAMUSCULAR | Status: DC | PRN

## 2013-02-24 MED ORDER — ASPIRIN 300 MG RE SUPP
300.0000 mg | RECTAL | Status: DC
  Filled 2013-02-24: qty 1

## 2013-02-24 MED ORDER — ASPIRIN 325 MG PO TABS
325.0000 mg | ORAL_TABLET | Freq: Every day | ORAL | Status: DC
  Administered 2013-02-25: 325 mg via ORAL
  Filled 2013-02-24: qty 1

## 2013-02-24 MED ORDER — LISINOPRIL 2.5 MG PO TABS
2.5000 mg | ORAL_TABLET | Freq: Every day | ORAL | Status: DC
  Administered 2013-02-25: 2.5 mg via ORAL
  Filled 2013-02-24: qty 1

## 2013-02-24 MED ORDER — ALBUTEROL SULFATE (2.5 MG/3ML) 0.083% IN NEBU: 3.0000 mL | INHALATION_SOLUTION | Freq: Four times a day (QID) | RESPIRATORY_TRACT | Status: DC | PRN

## 2013-02-24 MED ORDER — OXYMETAZOLINE HCL 0.05 % NA SOLN: 1.0000 | Freq: Four times a day (QID) | NASAL | Status: DC | PRN

## 2013-02-24 MED ORDER — ACETAMINOPHEN 325 MG PO TABS: 650.0000 mg | ORAL_TABLET | ORAL | Status: DC | PRN

## 2013-02-24 MED ORDER — HEPARIN SODIUM (PORCINE) 5000 UNIT/ML IJ SOLN
5000.0000 [IU] | Freq: Three times a day (TID) | INTRAMUSCULAR | Status: DC
  Administered 2013-02-24: 5000 [IU] via SUBCUTANEOUS
  Filled 2013-02-24 (×5): qty 1

## 2013-02-24 MED ORDER — ASPIRIN 81 MG PO CHEW
324.0000 mg | CHEWABLE_TABLET | Freq: Once | ORAL | Status: AC
  Administered 2013-02-24: 324 mg via ORAL
  Filled 2013-02-24: qty 4

## 2013-02-24 MED ORDER — ASPIRIN 81 MG PO CHEW: 324.0000 mg | CHEWABLE_TABLET | ORAL | Status: DC

## 2013-02-24 MED ORDER — SODIUM CHLORIDE 0.9 % IJ SOLN: 3.0000 mL | INTRAMUSCULAR | Status: DC | PRN

## 2013-02-24 MED ORDER — LORATADINE 10 MG PO TABS
10.0000 mg | ORAL_TABLET | Freq: Every day | ORAL | Status: DC
  Administered 2013-02-25: 10 mg via ORAL
  Filled 2013-02-24: qty 1

## 2013-02-24 MED ORDER — ATORVASTATIN CALCIUM 40 MG PO TABS
40.0000 mg | ORAL_TABLET | Freq: Every day | ORAL | Status: DC
  Administered 2013-02-24 – 2013-02-25 (×2): 40 mg via ORAL
  Filled 2013-02-24 (×2): qty 1

## 2013-02-24 MED ORDER — CLOPIDOGREL BISULFATE 75 MG PO TABS
75.0000 mg | ORAL_TABLET | Freq: Every day | ORAL | Status: DC
  Administered 2013-02-25: 75 mg via ORAL
  Filled 2013-02-24: qty 1

## 2013-02-24 NOTE — ED Provider Notes (Signed)
CSN: [id number]     Arrival date & time 02/24/13  1016 History   First MD Initiated Contact with Patient 02/24/13 1023     Chief Complaint  Patient presents with  . Chest Pain   (Consider location/radiation/quality/duration/timing/severity/associated sxs/prior Treatment) HPI Comments: 58 yo male with hx of STEMI in 2010 (followed by Dr. Allyson SabalBerry) now with multiple episodes of left sided, sharp chest pain. Almost axillary. No SOB, fever or N/V. Never had pain like this before. Started shortly after exertion while lifting boxes. Pain resolved on its own. Describes pain as intermittent every few minutes for about 30 minutes. No numbness, radiation or back pain. Pain resolved PTA. Never took NTG.   Past Medical History  Diagnosis Date  . History of acute anterior wall MI 05/14/2008    wth v. fib & defibrillation, cardiogenic shock wth IABP,, EMERGENCY STENTING  . H/O cardiac catheterization 05/2008    Xience DES to LAD, EF by Echo 08/08/08 >55%  . Hyperlipidemia LDL goal <70   . CAD (coronary artery disease), native coronary artery   . HTN (hypertension)   . Asthma    Past Surgical History  Procedure Laterality Date  . Coronary angioplasty with stent placement  05/14/08    Xience DES to LAD-emergently  . Hernia repair  1980, 06/13/07   Family History  Problem Relation Age of Onset  . Hypertension Mother   . Hyperlipidemia Mother   . Diabetes Father   . Hyperlipidemia Sister   . Hypertension Sister    History  Substance Use Topics  . Smoking status: Former Smoker    Quit date: 05/23/2008  . Smokeless tobacco: Not on file  . Alcohol Use: Not on file    Review of Systems  Constitutional: Negative for fever, diaphoresis and fatigue.  Respiratory: Negative for cough and shortness of breath.   Cardiovascular: Positive for chest pain. Negative for leg swelling.  Gastrointestinal: Negative for nausea, vomiting and abdominal pain.  Neurological: Negative for weakness.  All other systems  reviewed and are negative.    Allergies  Penicillins  Home Medications   Current Outpatient Rx  Name  Route  Sig  Dispense  Refill  . acetaminophen (TYLENOL) 650 MG CR tablet   Oral   Take 650 mg by mouth every 8 (eight) hours as needed for pain.         Marland Kitchen. acyclovir (ZOVIRAX) 400 MG tablet   Oral   Take 400 mg by mouth 3 (three) times daily as needed.         Marland Kitchen. albuterol (PROVENTIL HFA;VENTOLIN HFA) 108 (90 BASE) MCG/ACT inhaler   Inhalation   Inhale 2 puffs into the lungs every 6 (six) hours as needed for wheezing.         Marland Kitchen. aspirin 325 MG tablet   Oral   Take 325 mg by mouth daily.         Marland Kitchen. atorvastatin (LIPITOR) 40 MG tablet   Oral   Take 1 tablet (40 mg total) by mouth daily.   90 tablet   3   . carvedilol (COREG) 3.125 MG tablet   Oral   Take 1 tablet (3.125 mg total) by mouth 2 (two) times daily with a meal.   180 tablet   3   . cetirizine (ZYRTEC) 10 MG tablet   Oral   Take 10 mg by mouth daily.         . clopidogrel (PLAVIX) 75 MG tablet   Oral   Take 1  tablet (75 mg total) by mouth daily.   90 tablet   3   . eplerenone (INSPRA) 25 MG tablet   Oral   Take 1 tablet (25 mg total) by mouth daily.   90 tablet   3   . lisinopril (PRINIVIL,ZESTRIL) 2.5 MG tablet   Oral   Take 1 tablet (2.5 mg total) by mouth daily.   90 tablet   3   . Multiple Vitamin (MULTIVITAMIN) tablet   Oral   Take 1 tablet by mouth daily.         . nitroGLYCERIN (NITROSTAT) 0.4 MG SL tablet   Sublingual   Place 1 tablet (0.4 mg total) under the tongue every 5 (five) minutes as needed for chest pain.   25 tablet   1   . oxymetazoline (AFRIN) 0.05 % nasal spray   Each Nare   Place 1 spray into both nostrils 4 (four) times daily as needed for congestion.         . pantoprazole (PROTONIX) 40 MG tablet   Oral   Take 1 tablet (40 mg total) by mouth daily.   90 tablet   3    BP 138/98  Pulse 74  Temp(Src) 98 F (36.7 C) (Oral)  Resp 22  Ht 5\' 8"   (1.727 m)  Wt 186 lb (84.369 kg)  BMI 28.29 kg/m2  SpO2 100% Physical Exam  Nursing note and vitals reviewed. Constitutional: He is oriented to person, place, and time. He appears well-developed and well-nourished. No distress.  HENT:  Head: Normocephalic and atraumatic.  Right Ear: External ear normal.  Left Ear: External ear normal.  Nose: Nose normal.  Eyes: Right eye exhibits no discharge. Left eye exhibits no discharge.  Neck: Neck supple.  Cardiovascular: Normal rate, regular rhythm, normal heart sounds and intact distal pulses.   Pulmonary/Chest: Effort normal and breath sounds normal. He exhibits no tenderness.  Abdominal: Soft. There is no tenderness.  Musculoskeletal: He exhibits no edema.  Neurological: He is alert and oriented to person, place, and time.  Skin: Skin is warm and dry.    ED Course  Procedures (including critical care time) Labs Review Labs Reviewed  BASIC METABOLIC PANEL - Abnormal; Notable for the following:    Glucose, Bld 102 (*)    GFR calc non Af Amer 90 (*)    All other components within normal limits  CBC  TROPONIN I  PROTIME-INR  APTT  BASIC METABOLIC PANEL  PRO B NATRIURETIC PEPTIDE  CBC WITH DIFFERENTIAL  TROPONIN I  TROPONIN I  TSH  CBC  POCT I-STAT TROPONIN I   Imaging Review Dg Chest 2 View  02/24/2013   CLINICAL DATA:  Left side chest pain, weakness  EXAM: CHEST  2 VIEW  COMPARISON:  02/14/2009  FINDINGS: Cardiomediastinal silhouette is stable. No acute infiltrate or pleural effusion. No pulmonary edema. Stable calcified granuloma left base laterally. Mild degenerative changes thoracic spine.  IMPRESSION: No active cardiopulmonary disease.   Electronically Signed   By: Natasha Mead M.D.   On: 02/24/2013 11:23    EKG Interpretation    Date/Time:  Friday February 24 2013 10:20:40 EST Ventricular Rate:  73 PR Interval:  160 QRS Duration: 72 QT Interval:  370 QTC Calculation: 407 R Axis:   30 Text Interpretation:  Normal sinus  rhythm Cannot rule out Anterior infarct , age undetermined Abnormal ECG No significant change since last tracing Confirmed by Monterius Rolf  MD, Mako Pelfrey ([id number]) on 02/24/2013 10:24:16 AM  MDM   1. Chest pain    Patient's CP is atypical. Resolved prior to arrival. Given ASA here. Could be atypical ACS. Not high enough suspicion to start anticoagulants at this time given resolved sx. However, could be atypical, so will admit to cards for r/o.    Audree Camel, MD 02/24/13 2107

## 2013-02-24 NOTE — ED Notes (Signed)
Heart healthy diet ordered. 

## 2013-02-24 NOTE — ED Notes (Signed)
Pt reports left sided chest pain that started this morning about 0930. Not currently having pain, but has a previous hx of MI. Denies any N/V, SOB or diaphoresis. Cardiologist is Dr. Gery PrayBarry. Skin warm and dry. Reports he took 325 ASA this morning PTA.

## 2013-02-24 NOTE — H&P (Signed)
Patient ID: [name] MRN: [id number], DOB/AGE: [birth year]   Admit date: 02/24/2013   Primary Physician: No PCP Per Patient Primary Cardiologist: Dr. Allyson Sabal  Pt. Profile:  58 year old male who comes to the emergency room because of chest pain earlier today  Problem List  Past Medical History  Diagnosis Date  . History of acute anterior wall MI 05/14/2008    wth v. fib & defibrillation, cardiogenic shock wth IABP,, EMERGENCY STENTING  . H/O cardiac catheterization 05/2008    Xience DES to LAD, EF by Echo 08/08/08 >55%  . Hyperlipidemia LDL goal <70   . CAD (coronary artery disease), native coronary artery   . HTN (hypertension)   . Asthma     Past Surgical History  Procedure Laterality Date  . Coronary angioplasty with stent placement  05/14/08    Xience DES to LAD-emergently  . Hernia repair  1980, 06/13/07     Allergies  Allergies  Allergen Reactions  . Penicillins Hives and Swelling    HPI  This 58 year old gentleman was at work today and developed left chest pain.  He works as a Arboriculturist at [employer].  He had moved some furniture and he had lifted some boxes of books.  He subsequently noted the onset of several left lateral anterior chest pains which were described as quick and shooting and they were occurring at 5 minute intervals.  He had 4 or 5 of them and became concerned and had one of the associate ministers drive him to the emergency room.  The patient did not have any radiation of the pain down his arm.  There was no nausea or diaphoresis.  The pain was not worse with deep breathing.  He felt the urge to defecate and had a bowel movement prior to coming to the hospital. His past history reveals that in April 2010 he presented with an acute anterior wall myocardial infarction.  During the heart catheterization he had ventricular fibrillation and developed shock with intra-aortic balloon pump having to be placed.  He has a Xience DES stent  in his LAD placed on 05/14/08.  The patient had a treadmill Myoview stress test on 07/27/08.  He exercised for 11 minutes.  There was no evidence of any ischemia and his ejection fraction was 65% with no significant wall motion abnormalities noted.  His exercise capacity was 12 METS. The patient has not been getting any regular intentional aerobic exercise.  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  He has not been experiencing any recent exertional chest discomfort or dyspnea.  He states that he would prefer not to have another treadmill stress test because the last test was so difficult for him even though he did well on the test.  Home Medications  Prior to Admission medications   Medication Sig Start Date End Date Taking? Authorizing Provider  acetaminophen (TYLENOL) 650 MG CR tablet Take 650 mg by mouth every 8 (eight) hours as needed for pain.   Yes Historical Provider, MD  acyclovir (ZOVIRAX) 400 MG tablet Take 400 mg by mouth 3 (three) times daily as needed.   Yes Historical Provider, MD  albuterol (PROVENTIL HFA;VENTOLIN HFA) 108 (90 BASE) MCG/ACT inhaler Inhale 2 puffs into the lungs every 6 (six) hours as needed for wheezing.   Yes Historical Provider, MD  aspirin 325 MG tablet Take 325 mg by mouth daily.  Yes Historical Provider, MD  atorvastatin (LIPITOR) 40 MG tablet Take 1 tablet (40 mg total) by mouth daily. 10/24/12  Yes Runell Gess, MD  carvedilol (COREG) 3.125 MG tablet Take 1 tablet (3.125 mg total) by mouth 2 (two) times daily with a meal. 10/24/12  Yes Runell Gess, MD  cetirizine (ZYRTEC) 10 MG tablet Take 10 mg by mouth daily.   Yes Historical Provider, MD  clopidogrel (PLAVIX) 75 MG tablet Take 1 tablet (75 mg total) by mouth daily. 10/24/12  Yes Runell Gess, MD  eplerenone (INSPRA) 25 MG tablet Take 1 tablet (25 mg total) by mouth daily. 10/24/12  Yes Runell Gess, MD  lisinopril  (PRINIVIL,ZESTRIL) 2.5 MG tablet Take 1 tablet (2.5 mg total) by mouth daily. 10/24/12  Yes Runell Gess, MD  Multiple Vitamin (MULTIVITAMIN) tablet Take 1 tablet by mouth daily.   Yes Historical Provider, MD  nitroGLYCERIN (NITROSTAT) 0.4 MG SL tablet Place 1 tablet (0.4 mg total) under the tongue every 5 (five) minutes as needed for chest pain. 07/15/12  Yes Runell Gess, MD  oxymetazoline (AFRIN) 0.05 % nasal spray Place 1 spray into both nostrils 4 (four) times daily as needed for congestion.   Yes Historical Provider, MD  pantoprazole (PROTONIX) 40 MG tablet Take 1 tablet (40 mg total) by mouth daily. 10/24/12  Yes Runell Gess, MD    Family History  Family History  Problem Relation Age of Onset  . Hypertension Mother   . Hyperlipidemia Mother   . Diabetes Father   . Hyperlipidemia Sister   . Hypertension Sister     Social History  History   Social History  . Marital Status: Single    Spouse Name: N/A    Number of Children: N/A  . Years of Education: N/A   Occupational History  . Not on file.   Social History Main Topics  . Smoking status: Former Smoker    Quit date: 05/23/2008  . Smokeless tobacco: Not on file  . Alcohol Use: Not on file  . Drug Use: Not on file  . Sexual Activity: Not on file   Other Topics Concern  . Not on file   Social History Narrative  . No narrative on file     Review of Systems General:  No chills, fever, night sweats or weight changes.  Cardiovascular:  No chest pain, dyspnea on exertion, edema, orthopnea, palpitations, paroxysmal nocturnal dyspnea. Dermatological: No rash, lesions/masses Respiratory: No cough, dyspnea Urologic: No hematuria, dysuria Abdominal:   No nausea, vomiting, diarrhea, bright red blood per rectum, melena, or hematemesis Neurologic:  No visual changes, wkns, changes in mental status. All other systems reviewed and are otherwise negative except as noted above.  Physical Exam  Blood pressure  138/98, pulse 74, temperature 98 F (36.7 C), temperature source Oral, resp. rate 22, height 5\' 8"  (1.727 m), weight 186 lb (84.369 kg), SpO2 100.00%.  General: Pleasant, NAD.  Denies any chest pain since arriving at the hospital. Psych: Normal affect. Neuro: Alert and oriented X 3. Moves all extremities spontaneously. HEENT: Normal  Neck: Supple without bruits or JVD. Lungs:  Resp regular and unlabored, CTA.  No chest wall tenderness. Heart: RRR no s3, s4, or murmurs. Abdomen: Soft, non-tender, non-distended, BS + x 4.  Extremities: No clubbing, cyanosis or edema. DP/PT/Radials 2+ and equal bilaterally.  Labs  Troponin [facility] of Care Test)  Recent Labs  02/24/13 1100  TROPIPOC 0.00   No results found for this  basename: CKTOTAL, CKMB, TROPONINI,  in the last 72 hours Lab Results  Component Value Date   WBC 7.5 02/24/2013   HGB 14.9 02/24/2013   HCT 41.8 02/24/2013   MCV 86.9 02/24/2013   PLT 257 02/24/2013     Recent Labs Lab 02/24/13 1050  NA 141  K 4.0  CL 102  CO2 24  BUN 14  CREATININE 0.98  CALCIUM 10.0  GLUCOSE 102*   Lab Results  Component Value Date   CHOL 128 09/01/2012   HDL 42 09/01/2012   LDLCALC 63 09/01/2012   TRIG 115 09/01/2012   Lab Results  Component Value Date   DDIMER  Value: <0.22        AT THE INHOUSE ESTABLISHED CUTOFF VALUE OF 0.48 ug/mL FEU, THIS ASSAY HAS BEEN DOCUMENTED IN THE LITERATURE TO HAVE A SENSITIVITY AND NEGATIVE PREDICTIVE VALUE OF AT LEAST 98 TO 99%.  THE TEST RESULT SHOULD BE CORRELATED WITH AN ASSESSMENT OF THE CLINICAL PROBABILITY OF DVT / VTE. 02/14/2009     Radiology/Studies  Dg Chest 2 View  02/24/2013   CLINICAL DATA:  Left side chest pain, weakness  EXAM: CHEST  2 VIEW  COMPARISON:  02/14/2009  FINDINGS: Cardiomediastinal silhouette is stable. No acute infiltrate or pleural effusion. No pulmonary edema. Stable calcified granuloma left base laterally. Mild degenerative changes thoracic spine.  IMPRESSION: No active  cardiopulmonary disease.   Electronically Signed   By: Natasha MeadLiviu  Pop M.D.   On: 02/24/2013 11:23    ECG Normal sinus rhythm Cannot rule out Anterior infarct , age undetermined Abnormal ECG No significant change since last tracing Confirmed by GOLDSTON MD, SCOTT   ASSESSMENT AND PLAN  1 atypical chest pain.  Heart score of 3 for age and prior history of myocardial infarction and multiple risk factors. 2. Hypertension 3. Hypercholesterolemia 4. mild exogenous obesity  Plan: Admit for observation.  Serial cardiac enzymes.  Will get echocardiogram today.  Repeat EKG in a.m.  If he rules out and has no further symptoms he can probably be safely discharged in a.m. and followup as an outpatient with Dr. Allyson SabalBerry Signed, Cassell Clementhomas Atif Chapple, MD  02/24/2013, 12:15 PMf

## 2013-02-25 DIAGNOSIS — I10 Essential (primary) hypertension: Secondary | ICD-10-CM

## 2013-02-25 DIAGNOSIS — I2510 Atherosclerotic heart disease of native coronary artery without angina pectoris: Secondary | ICD-10-CM

## 2013-02-25 DIAGNOSIS — E785 Hyperlipidemia, unspecified: Secondary | ICD-10-CM

## 2013-02-25 DIAGNOSIS — I059 Rheumatic mitral valve disease, unspecified: Secondary | ICD-10-CM

## 2013-02-25 LAB — TROPONIN I

## 2013-02-25 MED ORDER — ASPIRIN EC 81 MG PO TBEC: 81.0000 mg | DELAYED_RELEASE_TABLET | Freq: Every day | ORAL | Status: DC

## 2013-02-25 NOTE — Progress Notes (Signed)
SUBJECTIVE:  No further complaints of CP  OBJECTIVE:   Vitals:   Filed Vitals:   02/24/13 1430 02/24/13 1529 02/24/13 2028 02/25/13 0450  BP:  127/81 113/74 113/71  Pulse: 72  69 67  Temp:  98.3 F (36.8 C) 98.3 F (36.8 C) 98 F (36.7 C)  TempSrc:   Oral Oral  Resp: 13  14 16   Height:      Weight:    188 lb 11.4 oz (85.6 kg)  SpO2: 97% 97% 97% 96%   I&O's:  No intake or output data in the 24 hours ending 02/25/13 1002 TELEMETRY: Reviewed telemetry pt in NSR:     PHYSICAL EXAM General: Well developed, well nourished, in no acute distress Head: Eyes PERRLA, No xanthomas.   Normal cephalic and atramatic  Lungs:   Clear bilaterally to auscultation and percussion. Heart:   HRRR S1 S2 Pulses are 2+ & equal. Abdomen: Bowel sounds are positive, abdomen soft and non-tender without masses  Extremities:   No clubbing, cyanosis or edema.  DP +1 Neuro: Alert and oriented X 3. Psych:  Good affect, responds appropriately   LABS: Basic Metabolic Panel:  Recent Labs  16/11/9599/23/15 1050 02/24/13 1602  NA 141 144  K 4.0 4.1  CL 102 105  CO2 24 24  GLUCOSE 102* 91  BUN 14 14  CREATININE 0.98 0.84  CALCIUM 10.0 10.0   Liver Function Tests: No results found for this basename: AST, ALT, ALKPHOS, BILITOT, PROT, ALBUMIN,  in the last 72 hours No results found for this basename: LIPASE, AMYLASE,  in the last 72 hours CBC:  Recent Labs  02/24/13 1050 02/24/13 1602  WBC 7.5 8.0  NEUTROABS  --  5.0  HGB 14.9 14.0  HCT 41.8 39.8  MCV 86.9 87.3  PLT 257 261   Cardiac Enzymes:  Recent Labs  02/24/13 1602 02/24/13 2200 02/25/13 0451  TROPONINI <0.30 <0.30 <0.30   BNP: No components found with this basename: POCBNP,  D-Dimer: No results found for this basename: DDIMER,  in the last 72 hours Hemoglobin A1C: No results found for this basename: HGBA1C,  in the last 72 hours Fasting Lipid Panel: No results found for this basename: CHOL, HDL, LDLCALC, TRIG, CHOLHDL,  LDLDIRECT,  in the last 72 hours Thyroid Function Tests:  Recent Labs  02/24/13 1602  TSH 1.220   Anemia Panel: No results found for this basename: VITAMINB12, FOLATE, FERRITIN, TIBC, IRON, RETICCTPCT,  in the last 72 hours Coag Panel:   Lab Results  Component Value Date   INR 0.98 02/24/2013   INR 0.95 02/14/2009   INR 1.7* 05/23/2008    RADIOLOGY: Dg Chest 2 View  02/24/2013   CLINICAL DATA:  Left side chest pain, weakness  EXAM: CHEST  2 VIEW  COMPARISON:  02/14/2009  FINDINGS: Cardiomediastinal silhouette is stable. No acute infiltrate or pleural effusion. No pulmonary edema. Stable calcified granuloma left base laterally. Mild degenerative changes thoracic spine.  IMPRESSION: No active cardiopulmonary disease.   Electronically Signed   By: Natasha MeadLiviu  Pop M.D.   On: 02/24/2013 11:23    ASSESSMENT AND PLAN  1 atypical chest pain. Heart score of 3 for age and prior history of myocardial infarction and multiple risk factors.  Troponins negative x 3 and BNP normal.  No further CP.  2D echo with normal LVF EF 55% with no RWMA's and grade I diastolic dysfunction.  He has been up ambulating with no further CP.  EKG this am normal. 2. Hypertension -  controlled 3. Hypercholesterolemia  4. mild exogenous obesity   Plan:  Discharge home today with early followup as an outpatient with Dr. Allyson Sabal next week Continue ASA/statin/Plavix/beta blocker/ACE I   Quintella Reichert, MD  02/25/2013  10:02 AM

## 2013-02-25 NOTE — Progress Notes (Signed)
Echo Lab  2D Echocardiogram completed.  Shianne Zeiser L Jadore Veals, RDCS 02/25/2013 9:29 AM

## 2013-02-25 NOTE — Discharge Instructions (Signed)
Chest Pain (Nonspecific) °It is often hard to give a specific diagnosis for the cause of chest pain. There is always a chance that your pain could be related to something serious, such as a heart attack or a blood clot in the lungs. You need to follow up with your caregiver for further evaluation. °CAUSES  °· Heartburn. °· Pneumonia or bronchitis. °· Anxiety or stress. °· Inflammation around your heart (pericarditis) or lung (pleuritis or pleurisy). °· A blood clot in the lung. °· A collapsed lung (pneumothorax). It can develop suddenly on its own (spontaneous pneumothorax) or from injury (trauma) to the chest. °· Shingles infection (herpes zoster virus). °The chest wall is composed of bones, muscles, and cartilage. Any of these can be the source of the pain. °· The bones can be bruised by injury. °· The muscles or cartilage can be strained by coughing or overwork. °· The cartilage can be affected by inflammation and become sore (costochondritis). °DIAGNOSIS  °Lab tests or other studies, such as X-rays, electrocardiography, stress testing, or cardiac imaging, may be needed to find the cause of your pain.  °TREATMENT  °· Treatment depends on what may be causing your chest pain. Treatment may include: °· Acid blockers for heartburn. °· Anti-inflammatory medicine. °· Pain medicine for inflammatory conditions. °· Antibiotics if an infection is present. °· You may be advised to change lifestyle habits. This includes stopping smoking and avoiding alcohol, caffeine, and chocolate. °· You may be advised to keep your head raised (elevated) when sleeping. This reduces the chance of acid going backward from your stomach into your esophagus. °· Most of the time, nonspecific chest pain will improve within 2 to 3 days with rest and mild pain medicine. °HOME CARE INSTRUCTIONS  °· If antibiotics were prescribed, take your antibiotics as directed. Finish them even if you start to feel better. °· For the next few days, avoid physical  activities that bring on chest pain. Continue physical activities as directed. °· Do not smoke. °· Avoid drinking alcohol. °· Only take over-the-counter or prescription medicine for pain, discomfort, or fever as directed by your caregiver. °· Follow your caregiver's suggestions for further testing if your chest pain does not go away. °· Keep any follow-up appointments you made. If you do not go to an appointment, you could develop lasting (chronic) problems with pain. If there is any problem keeping an appointment, you must call to reschedule. °SEEK MEDICAL CARE IF:  °· You think you are having problems from the medicine you are taking. Read your medicine instructions carefully. °· Your chest pain does not go away, even after treatment. °· You develop a rash with blisters on your chest. °SEEK IMMEDIATE MEDICAL CARE IF:  °· You have increased chest pain or pain that spreads to your arm, neck, jaw, back, or abdomen. °· You develop shortness of breath, an increasing cough, or you are coughing up blood. °· You have severe back or abdominal pain, feel nauseous, or vomit. °· You develop severe weakness, fainting, or chills. °· You have a fever. °THIS IS AN EMERGENCY. Do not wait to see if the pain will go away. Get medical help at once. Call your local emergency services (911 in [place] Do not drive yourself to the hospital. °MAKE SURE YOU:  °· Understand these instructions. °· Will watch your condition. °· Will get help right away if you are not doing well or get worse. °Document Released: 10/29/2004 Document Revised: 04/13/2011 Document Reviewed: 08/25/2007 °[employer]® Patient Information ©2014 [employer]   [employer]. ° °

## 2013-02-25 NOTE — Discharge Summary (Signed)
Patient ID: [name],  MRN: [id number], DOB/AGE: 04/04/1955 58 y.o.  Admit date: 02/24/2013 Discharge date: 02/25/2013  Primary Care Provider:  Primary Cardiologist: Dr Gwenlyn Found  Discharge Diagnoses Active Problems:   Chest pain with moderate risk of acute coronary syndrome   CAD - LAD DES 2010   History of acute anterior wall MI   Hyperlipidemia LDL goal <70      Hospital Course: 58 y/o male who presented in April 2010 he presented with an acute anterior wall myocardial infarction. During the heart catheterization he had ventricular fibrillation and developed shock with intra-aortic balloon pump having to be placed. He has a Xience DES stent in his LAD placed on 05/14/08. The patient had a treadmill Myoview stress test on 07/27/08. There was no evidence of any ischemia and his ejection fraction was 65% with no significant wall motion abnormalities noted.            He presented 02/24/13 with chest pain after moving some books in boxes at his [employer]. He ruled out for an MI. An echo was essentially normal. He has had no further pain and Dr Radford Pax feels he can be discharged. We will arrange office follow up in 2 weeks.   Discharge Vitals:  Blood pressure 113/78, pulse 67, temperature 98 F (36.7 C), temperature source Oral, resp. rate 16, height 5' 8" (1.727 m), weight 188 lb 11.4 oz (85.6 kg), SpO2 96.00%.    Labs: Results for orders placed during the hospital encounter of 02/24/13 (from the past 48 hour(s))  CBC     Status: None   Collection Time    02/24/13 10:50 AM      Result Value Range   WBC 7.5  4.0 - 10.5 K/uL   RBC 4.81  4.22 - 5.81 MIL/uL   Hemoglobin 14.9  13.0 - 17.0 g/dL   HCT 41.8  39.0 - 52.0 %   MCV 86.9  78.0 - 100.0 fL   MCH 31.0  26.0 - 34.0 pg   MCHC 35.6  30.0 - 36.0 g/dL   RDW 12.4  11.5 - 15.5 %   Platelets 257  150 - 400 K/uL  BASIC METABOLIC PANEL     Status: Abnormal   Collection Time    02/24/13 10:50 AM      Result Value Range   Sodium 141  137 -  147 mEq/L   Potassium 4.0  3.7 - 5.3 mEq/L   Chloride 102  96 - 112 mEq/L   CO2 24  19 - 32 mEq/L   Glucose, Bld 102 (*) 70 - 99 mg/dL   BUN 14  6 - 23 mg/dL   Creatinine, Ser 0.98  0.50 - 1.35 mg/dL   Calcium 10.0  8.4 - 10.5 mg/dL   GFR calc non Af Amer 90 (*) >90 mL/min   GFR calc Af Amer >90  >90 mL/min   Comment: (NOTE)     The eGFR has been calculated using the CKD EPI equation.     This calculation has not been validated in all clinical situations.     eGFR's persistently <90 mL/min signify possible Chronic Kidney     Disease.  POCT I-STAT TROPONIN I     Status: None   Collection Time    02/24/13 11:00 AM      Result Value Range   Troponin i, poc 0.00  0.00 - 0.08 ng/mL   Comment 3            Comment:  Due to the release kinetics of cTnI,     a negative result within the first hours     of the onset of symptoms does not rule out     myocardial infarction with certainty.     If myocardial infarction is still suspected,     repeat the test at appropriate intervals.  TROPONIN I     Status: None   Collection Time    02/24/13  4:02 PM      Result Value Range   Troponin I <0.30  <0.30 ng/mL   Comment:            Due to the release kinetics of cTnI,     a negative result within the first hours     of the onset of symptoms does not rule out     myocardial infarction with certainty.     If myocardial infarction is still suspected,     repeat the test at appropriate intervals.  PROTIME-INR     Status: None   Collection Time    02/24/13  4:02 PM      Result Value Range   Prothrombin Time 12.8  11.6 - 15.2 seconds   INR 0.98  0.00 - 1.49  APTT     Status: None   Collection Time    02/24/13  4:02 PM      Result Value Range   aPTT 30  24 - 37 seconds  BASIC METABOLIC PANEL     Status: None   Collection Time    02/24/13  4:02 PM      Result Value Range   Sodium 144  137 - 147 mEq/L   Potassium 4.1  3.7 - 5.3 mEq/L   Chloride 105  96 - 112 mEq/L   CO2 24  19 - 32 mEq/L    Glucose, Bld 91  70 - 99 mg/dL   BUN 14  6 - 23 mg/dL   Creatinine, Ser 0.84  0.50 - 1.35 mg/dL   Calcium 10.0  8.4 - 10.5 mg/dL   GFR calc non Af Amer >90  >90 mL/min   GFR calc Af Amer >90  >90 mL/min   Comment: (NOTE)     The eGFR has been calculated using the CKD EPI equation.     This calculation has not been validated in all clinical situations.     eGFR's persistently <90 mL/min signify possible Chronic Kidney     Disease.  PRO B NATRIURETIC PEPTIDE     Status: None   Collection Time    02/24/13  4:02 PM      Result Value Range   Pro B Natriuretic peptide (BNP) 25.9  0 - 125 pg/mL  CBC WITH DIFFERENTIAL     Status: None   Collection Time    02/24/13  4:02 PM      Result Value Range   WBC 8.0  4.0 - 10.5 K/uL   RBC 4.56  4.22 - 5.81 MIL/uL   Hemoglobin 14.0  13.0 - 17.0 g/dL   HCT 39.8  39.0 - 52.0 %   MCV 87.3  78.0 - 100.0 fL   MCH 30.7  26.0 - 34.0 pg   MCHC 35.2  30.0 - 36.0 g/dL   RDW 12.5  11.5 - 15.5 %   Platelets 261  150 - 400 K/uL   Neutrophils Relative % 63  43 - 77 %   Neutro Abs 5.0  1.7 - 7.7 K/uL   Lymphocytes  Relative 27  12 - 46 %   Lymphs Abs 2.2  0.7 - 4.0 K/uL   Monocytes Relative 7  3 - 12 %   Monocytes Absolute 0.6  0.1 - 1.0 K/uL   Eosinophils Relative 2  0 - 5 %   Eosinophils Absolute 0.2  0.0 - 0.7 K/uL   Basophils Relative 1  0 - 1 %   Basophils Absolute 0.0  0.0 - 0.1 K/uL  TSH     Status: None   Collection Time    02/24/13  4:02 PM      Result Value Range   TSH 1.220  0.350 - 4.500 uIU/mL   Comment: Performed at [facility]  TROPONIN I     Status: None   Collection Time    02/24/13 10:00 PM      Result Value Range   Troponin I <0.30  <0.30 ng/mL   Comment:            Due to the release kinetics of cTnI,     a negative result within the first hours     of the onset of symptoms does not rule out     myocardial infarction with certainty.     If myocardial infarction is still suspected,     repeat the test at  appropriate intervals.  TROPONIN I     Status: None   Collection Time    02/25/13  4:51 AM      Result Value Range   Troponin I <0.30  <0.30 ng/mL   Comment:            Due to the release kinetics of cTnI,     a negative result within the first hours     of the onset of symptoms does not rule out     myocardial infarction with certainty.     If myocardial infarction is still suspected,     repeat the test at appropriate intervals.    Disposition:      Follow-up Information   Follow up with Kerin Ransom K, PA-C. (office will call you)    Specialty:  Cardiology   Contact information:   [address] [place] [address] [phone]       Discharge Medications:    Medication List    STOP taking these medications       aspirin 325 MG tablet  Replaced by:  aspirin EC 81 MG tablet      TAKE these medications       acetaminophen 650 MG CR tablet  Commonly known as:  TYLENOL  Take 650 mg by mouth every 8 (eight) hours as needed for pain.     acyclovir 400 MG tablet  Commonly known as:  ZOVIRAX  Take 400 mg by mouth 3 (three) times daily as needed.     albuterol 108 (90 BASE) MCG/ACT inhaler  Commonly known as:  PROVENTIL HFA;VENTOLIN HFA  Inhale 2 puffs into the lungs every 6 (six) hours as needed for wheezing.     aspirin EC 81 MG tablet  Take 1 tablet (81 mg total) by mouth daily.     atorvastatin 40 MG tablet  Commonly known as:  LIPITOR  Take 1 tablet (40 mg total) by mouth daily.     carvedilol 3.125 MG tablet  Commonly known as:  COREG  Take 1 tablet (3.125 mg total) by mouth 2 (two) times daily with a meal.     cetirizine 10 MG  tablet  Commonly known as:  ZYRTEC  Take 10 mg by mouth daily.     clopidogrel 75 MG tablet  Commonly known as:  PLAVIX  Take 1 tablet (75 mg total) by mouth daily.     eplerenone 25 MG tablet  Commonly known as:  INSPRA  Take 1 tablet (25 mg total) by mouth daily.     lisinopril 2.5 MG tablet  Commonly  known as:  PRINIVIL,ZESTRIL  Take 1 tablet (2.5 mg total) by mouth daily.     multivitamin tablet  Take 1 tablet by mouth daily.     nitroGLYCERIN 0.4 MG SL tablet  Commonly known as:  NITROSTAT  Place 1 tablet (0.4 mg total) under the tongue every 5 (five) minutes as needed for chest pain.     oxymetazoline 0.05 % nasal spray  Commonly known as:  AFRIN  Place 1 spray into both nostrils 4 (four) times daily as needed for congestion.     pantoprazole 40 MG tablet  Commonly known as:  PROTONIX  Take 1 tablet (40 mg total) by mouth daily.         Duration of Discharge Encounter: Greater than 30 minutes including physician time.  Angelena Form PA-C 02/25/2013 11:17 AM

## 2013-07-18 ENCOUNTER — Telehealth: Payer: Self-pay | Admitting: Cardiovascular Disease

## 2013-07-21 NOTE — Telephone Encounter (Signed)
Closed encounter °

## 2013-09-04 ENCOUNTER — Ambulatory Visit (INDEPENDENT_AMBULATORY_CARE_PROVIDER_SITE_OTHER): Payer: 59 | Admitting: Cardiovascular Disease

## 2013-09-04 ENCOUNTER — Encounter: Payer: Self-pay | Admitting: Cardiovascular Disease

## 2013-09-04 VITALS — BP 120/88 | HR 70 | Ht 68.5 in | Wt 197.0 lb

## 2013-09-04 DIAGNOSIS — I2510 Atherosclerotic heart disease of native coronary artery without angina pectoris: Secondary | ICD-10-CM

## 2013-09-04 DIAGNOSIS — I10 Essential (primary) hypertension: Secondary | ICD-10-CM

## 2013-09-04 DIAGNOSIS — E785 Hyperlipidemia, unspecified: Secondary | ICD-10-CM

## 2013-09-04 MED ORDER — ATORVASTATIN CALCIUM 40 MG PO TABS: 40.0000 mg | ORAL_TABLET | Freq: Every day | ORAL | Status: DC

## 2013-09-04 MED ORDER — LISINOPRIL 2.5 MG PO TABS: 2.5000 mg | ORAL_TABLET | Freq: Every day | ORAL | Status: DC

## 2013-09-04 MED ORDER — EPLERENONE 25 MG PO TABS: 25.0000 mg | ORAL_TABLET | Freq: Every day | ORAL | Status: DC

## 2013-09-04 MED ORDER — CARVEDILOL 3.125 MG PO TABS: 3.1250 mg | ORAL_TABLET | Freq: Two times a day (BID) | ORAL | Status: DC

## 2013-09-04 MED ORDER — NITROGLYCERIN 0.4 MG SL SUBL: 0.4000 mg | SUBLINGUAL_TABLET | SUBLINGUAL | Status: DC | PRN

## 2013-09-04 MED ORDER — CLOPIDOGREL BISULFATE 75 MG PO TABS: 75.0000 mg | ORAL_TABLET | Freq: Every day | ORAL | Status: DC

## 2013-09-04 MED ORDER — PANTOPRAZOLE SODIUM 40 MG PO TBEC: 40.0000 mg | DELAYED_RELEASE_TABLET | Freq: Every day | ORAL | Status: DC

## 2013-09-04 NOTE — Progress Notes (Signed)
09/04/2013 [name]   1955/04/13  [id number]  Primary Physician Devra Dopp, MD Primary Cardiologist: Runell Gess MD [name]   HPI:  The patient is a very pleasant 58 year old, mildly overweight, married Caucasian male, father of 3 living children (1 son was killed in [place] July 22, 2004, by a sniper,) grandfather to 4 grandchildren and 4 step-grandchildren who I last saw in the office July of 2014. He suffered an anterior wall myocardial infarction May 14, 2008, complicated by ventricular fibrillation requiring defibrillation. He had cardiogenic shock requiring intraaortic balloon pumping. He was stented with a 3.5 x 20 mm long Xience drug-eluting stent. His EF at that time was 45% with anteroapical wall motion abnormality. Since that time his diet has changed, he stopped smoking and he exercises. His EF improved and followup Myoview performed several months later showed significant salvage of myocardium with no evidence of scar. His last lipid profile performed 06/21/13 revealed a total cholesterol of 144, LDL 71 and HDL of 54. He denies chest pain or shortness of breath.     Current Outpatient Prescriptions  Medication Sig Dispense Refill  . acetaminophen (TYLENOL) 650 MG CR tablet Take 650 mg by mouth every 8 (eight) hours as needed for pain.      Marland Kitchen acyclovir (ZOVIRAX) 400 MG tablet Take 400 mg by mouth 3 (three) times daily as needed.      Marland Kitchen albuterol (PROVENTIL HFA;VENTOLIN HFA) 108 (90 BASE) MCG/ACT inhaler Inhale 2 puffs into the lungs every 6 (six) hours as needed for wheezing.      Marland Kitchen aspirin EC 81 MG tablet Take 162 mg by mouth daily.      Marland Kitchen atorvastatin (LIPITOR) 40 MG tablet Take 1 tablet (40 mg total) by mouth daily.  90 tablet  3  . carvedilol (COREG) 3.125 MG tablet Take 1 tablet (3.125 mg total) by mouth 2 (two) times daily with a meal.  180 tablet  3  . cetirizine (ZYRTEC) 10 MG tablet Take 10 mg by mouth daily.      . clopidogrel (PLAVIX) 75  MG tablet Take 1 tablet (75 mg total) by mouth daily.  90 tablet  3  . Coenzyme Q10 (CO Q-10 MAXIMUM STRENGTH PO) Take 250 mg by mouth daily.      Marland Kitchen eplerenone (INSPRA) 25 MG tablet Take 1 tablet (25 mg total) by mouth daily.  90 tablet  3  . fluticasone (FLONASE) 50 MCG/ACT nasal spray Place 2 sprays into the nose as needed.       Marland Kitchen lisinopril (PRINIVIL,ZESTRIL) 2.5 MG tablet Take 1 tablet (2.5 mg total) by mouth daily.  90 tablet  3  . Multiple Vitamin (MULTIVITAMIN) tablet Take 1 tablet by mouth daily.      . nitroGLYCERIN (NITROSTAT) 0.4 MG SL tablet Place 1 tablet (0.4 mg total) under the tongue every 5 (five) minutes as needed for chest pain.  25 tablet  1  . oxymetazoline (AFRIN) 0.05 % nasal spray Place 1 spray into both nostrils 4 (four) times daily as needed for congestion.      . pantoprazole (PROTONIX) 40 MG tablet Take 1 tablet (40 mg total) by mouth daily.  90 tablet  3   No current facility-administered medications for this visit.    Allergies  Allergen Reactions  . Demeclocycline Hives  . Penicillins Hives and Swelling    History   Social History  . Marital Status: Married    Spouse Name: N/A  Number of Children: N/A  . Years of Education: N/A   Occupational History  . Not on file.   Social History Main Topics  . Smoking status: Former Smoker    Quit date: 05/23/2008  . Smokeless tobacco: Not on file  . Alcohol Use: Not on file  . Drug Use: Not on file  . Sexual Activity: Not on file   Other Topics Concern  . Not on file   Social History Narrative  . No narrative on file     Review of Systems: General: negative for chills, fever, night sweats or weight changes.  Cardiovascular: negative for chest pain, dyspnea on exertion, edema, orthopnea, palpitations, paroxysmal nocturnal dyspnea or shortness of breath Dermatological: negative for rash Respiratory: negative for cough or wheezing Urologic: negative for hematuria Abdominal: negative for nausea,  vomiting, diarrhea, bright red blood per rectum, melena, or hematemesis Neurologic: negative for visual changes, syncope, or dizziness All other systems reviewed and are otherwise negative except as noted above.    Blood pressure 120/88, pulse 70, height 5' 8.5" (1.74 m), weight 197 lb (89.359 kg).  General appearance: alert and no distress Neck: no adenopathy, no carotid bruit, no JVD, supple, symmetrical, trachea midline and thyroid not enlarged, symmetric, no tenderness/mass/nodules Lungs: clear to auscultation bilaterally Heart: regular rate and rhythm, S1, S2 normal, no murmur, click, rub or gallop Extremities: extremities normal, atraumatic, no cyanosis or edema  EKG normal sinus rhythm at 70 without ST or T wave changes  ASSESSMENT AND PLAN:   CAD - LAD DES 2010 History of CAD status post acute anterior wall myocardial infarction 05/14/08, located by ventricular fibrillation requiring defibrillation. He had cardiogenic shock requiring intra-aortic balloon pumping. I stented his LAD with a 3.5 x 20 balloon long drug-eluting stent. His EF was 45% of the time and hasn't improved by 2-D echo. He denies chest pain or shortness of breath.  HTN (hypertension) Controlled on current medications  Hyperlipidemia LDL goal <70 On statin therapy with recent lipid profile performed 06/2513 revealing a total cholesterol 144, LDL 71, HDL 54 and triglycerides of 94      Runell GessJonathan J. Tomasita Beevers MD [facility] CodFACP,FACC,FAHA, [facility] Carolina[facility]SCAI 09/04/2013 4:41 PM

## 2013-09-04 NOTE — Assessment & Plan Note (Signed)
History of CAD status post acute anterior wall myocardial infarction 05/14/08, located by ventricular fibrillation requiring defibrillation. He had cardiogenic shock requiring intra-aortic balloon pumping. I stented his LAD with a 3.5 x 20 balloon long drug-eluting stent. His EF was 45% of the time and hasn't improved by 2-D echo. He denies chest pain or shortness of breath.

## 2013-09-04 NOTE — Patient Instructions (Signed)
Your physician wants you to follow-up in: 1 year with Dr Berry. You will receive a reminder letter in the mail two months in advance. If you don't receive a letter, please call our office to schedule the follow-up appointment.  

## 2013-09-04 NOTE — Assessment & Plan Note (Signed)
On statin therapy with recent lipid profile performed 06/2513 revealing a total cholesterol 144, LDL 71, HDL 54 and triglycerides of 94

## 2013-09-04 NOTE — Assessment & Plan Note (Signed)
Controlled on current medications 

## 2013-09-06 ENCOUNTER — Telehealth: Payer: Self-pay | Admitting: Cardiovascular Disease

## 2013-09-06 MED ORDER — CARVEDILOL 3.125 MG PO TABS: 3.1250 mg | ORAL_TABLET | Freq: Two times a day (BID) | ORAL | Status: DC

## 2013-09-06 MED ORDER — NITROGLYCERIN 0.4 MG SL SUBL: 0.4000 mg | SUBLINGUAL_TABLET | SUBLINGUAL | Status: DC | PRN

## 2013-09-06 MED ORDER — CLOPIDOGREL BISULFATE 75 MG PO TABS: 75.0000 mg | ORAL_TABLET | Freq: Every day | ORAL | Status: DC

## 2013-09-06 MED ORDER — ATORVASTATIN CALCIUM 40 MG PO TABS: 40.0000 mg | ORAL_TABLET | Freq: Every day | ORAL | Status: DC

## 2013-09-06 MED ORDER — PANTOPRAZOLE SODIUM 40 MG PO TBEC: 40.0000 mg | DELAYED_RELEASE_TABLET | Freq: Every day | ORAL | Status: DC

## 2013-09-06 MED ORDER — EPLERENONE 25 MG PO TABS: 25.0000 mg | ORAL_TABLET | Freq: Every day | ORAL | Status: DC

## 2013-09-06 MED ORDER — LISINOPRIL 2.5 MG PO TABS: 2.5000 mg | ORAL_TABLET | Freq: Every day | ORAL | Status: DC

## 2013-09-06 NOTE — Telephone Encounter (Signed)
Pt wants to talk to a nurse about his medicine,said it was called into the wrong place. He said it was too much for me to take down,he would explain everything to the nurse.

## 2013-09-06 NOTE — Telephone Encounter (Signed)
[facility]  Sent in error to [facility] at visit.

## 2014-08-28 ENCOUNTER — Ambulatory Visit: Payer: Managed Care, Other (non HMO) | Admitting: Cardiovascular Disease

## 2014-09-04 ENCOUNTER — Encounter: Payer: Self-pay | Admitting: Cardiovascular Disease

## 2014-09-04 ENCOUNTER — Ambulatory Visit (INDEPENDENT_AMBULATORY_CARE_PROVIDER_SITE_OTHER): Payer: 59 | Admitting: Cardiovascular Disease

## 2014-09-04 VITALS — BP 120/80 | HR 74 | Ht 68.5 in | Wt 200.0 lb

## 2014-09-04 DIAGNOSIS — I10 Essential (primary) hypertension: Secondary | ICD-10-CM

## 2014-09-04 DIAGNOSIS — Z79899 Other long term (current) drug therapy: Secondary | ICD-10-CM

## 2014-09-04 DIAGNOSIS — I2510 Atherosclerotic heart disease of native coronary artery without angina pectoris: Secondary | ICD-10-CM | POA: Diagnosis not present

## 2014-09-04 DIAGNOSIS — E785 Hyperlipidemia, unspecified: Secondary | ICD-10-CM | POA: Diagnosis not present

## 2014-09-04 LAB — HEPATIC FUNCTION PANEL
ALT: 33 U/L (ref 9–46)
AST: 23 U/L (ref 10–35)
Albumin: 4.4 g/dL (ref 3.6–5.1)
Alkaline Phosphatase: 92 U/L (ref 40–115)
Bilirubin, Direct: 0.2 mg/dL (ref <=0.2)
Indirect Bilirubin: 0.6 mg/dL (ref 0.2–1.2)
Total Bilirubin: 0.8 mg/dL (ref 0.2–1.2)
Total Protein: 7.3 g/dL (ref 6.1–8.1)

## 2014-09-04 LAB — LIPID PANEL
CHOLESTEROL: 149 mg/dL (ref 125–200)
HDL: 48 mg/dL (ref >=40)
LDL Cholesterol: 73 mg/dL (ref <130)
Total CHOL/HDL Ratio: 3.1 Ratio (ref <=5.0)
Triglycerides: 139 mg/dL (ref <150)
VLDL: 28 mg/dL (ref <30)

## 2014-09-04 MED ORDER — EPLERENONE 25 MG PO TABS: 25.0000 mg | ORAL_TABLET | Freq: Every day | ORAL | Status: DC

## 2014-09-04 MED ORDER — CLOPIDOGREL BISULFATE 75 MG PO TABS: 75.0000 mg | ORAL_TABLET | Freq: Every day | ORAL | Status: DC

## 2014-09-04 MED ORDER — CARVEDILOL 3.125 MG PO TABS: 3.1250 mg | ORAL_TABLET | Freq: Two times a day (BID) | ORAL | Status: DC

## 2014-09-04 MED ORDER — PANTOPRAZOLE SODIUM 40 MG PO TBEC: 40.0000 mg | DELAYED_RELEASE_TABLET | Freq: Every day | ORAL | Status: DC

## 2014-09-04 MED ORDER — ATORVASTATIN CALCIUM 40 MG PO TABS: 40.0000 mg | ORAL_TABLET | Freq: Every day | ORAL | Status: DC

## 2014-09-04 MED ORDER — LISINOPRIL 2.5 MG PO TABS: 2.5000 mg | ORAL_TABLET | Freq: Every day | ORAL | Status: DC

## 2014-09-04 MED ORDER — NITROGLYCERIN 0.4 MG/SPRAY TL SOLN: 1.0000 | Status: DC | PRN

## 2014-09-04 NOTE — Progress Notes (Signed)
09/04/2014 [name]   1955-06-21  [id number]  Primary Physician Devra Dopp, MD Primary Cardiologist: Runell Gess MD [name]   HPI:  The patient is a very pleasant 59 year old, mildly overweight, married Caucasian male, father of 3 living children (1 son was killed in [place] July 22, 2004, by a sniper,) grandfather to 4 grandchildren and 4 step-grandchildren who I last saw in the office 09/04/13. He suffered an anterior wall myocardial infarction May 14, 2008, complicated by ventricular fibrillation requiring defibrillation. He had cardiogenic shock requiring intraaortic balloon pumping. He was stented with a 3.5 x 20 mm long Xience drug-eluting stent. His EF at that time was 45% with anteroapical wall motion abnormality. Since that time his diet has changed, he stopped smoking and he exercises. His EF improved and followup Myoview performed several months later showed significant salvage of myocardium with no evidence of scar. His last lipid profile performed 06/21/13 revealed a total cholesterol of 144, LDL 71 and HDL of 54. He denies chest pain or shortness of breath.   Current Outpatient Prescriptions  Medication Sig Dispense Refill  . acetaminophen (TYLENOL) 650 MG CR tablet Take 650 mg by mouth every 8 (eight) hours as needed for pain.    Marland Kitchen acyclovir (ZOVIRAX) 400 MG tablet Take 400 mg by mouth 3 (three) times daily as needed.    Marland Kitchen albuterol (PROVENTIL HFA;VENTOLIN HFA) 108 (90 BASE) MCG/ACT inhaler Inhale 2 puffs into the lungs every 6 (six) hours as needed for wheezing.    Marland Kitchen aspirin EC 81 MG tablet Take 162 mg by mouth daily.    Marland Kitchen atorvastatin (LIPITOR) 40 MG tablet Take 1 tablet (40 mg total) by mouth daily. 90 tablet 3  . carvedilol (COREG) 3.125 MG tablet Take 1 tablet (3.125 mg total) by mouth 2 (two) times daily with a meal. 180 tablet 3  . cetirizine (ZYRTEC) 10 MG tablet Take 10 mg by mouth daily.    . clopidogrel (PLAVIX) 75 MG tablet Take 1 tablet  (75 mg total) by mouth daily. 90 tablet 3  . Coenzyme Q10 (CO Q-10 MAXIMUM STRENGTH PO) Take 250 mg by mouth daily.    Marland Kitchen eplerenone (INSPRA) 25 MG tablet Take 1 tablet (25 mg total) by mouth daily. 90 tablet 3  . lisinopril (PRINIVIL,ZESTRIL) 2.5 MG tablet Take 1 tablet (2.5 mg total) by mouth daily. 90 tablet 3  . Multiple Vitamin (MULTIVITAMIN) tablet Take 1 tablet by mouth daily.    Marland Kitchen oxymetazoline (AFRIN) 0.05 % nasal spray Place 1 spray into both nostrils 4 (four) times daily as needed for congestion.    . pantoprazole (PROTONIX) 40 MG tablet Take 1 tablet (40 mg total) by mouth daily. 90 tablet 3  . sertraline (ZOLOFT) 50 MG tablet Take 50 mg by mouth.    . fluticasone (FLONASE) 50 MCG/ACT nasal spray Place 2 sprays into the nose as needed.     . nitroGLYCERIN (NITROLINGUAL) 0.4 MG/SPRAY spray Place 1 spray under the tongue every 5 (five) minutes x 3 doses as needed for chest pain. 12 g 12   No current facility-administered medications for this visit.    Allergies  Allergen Reactions  . Demeclocycline Hives  . Penicillins Hives and Swelling    History   Social History  . Marital Status: Married    Spouse Name: N/A  . Number of Children: N/A  . Years of Education: N/A   Occupational History  . Not on file.   Social History Main  Topics  . Smoking status: Former Smoker    Quit date: 05/23/2008  . Smokeless tobacco: Not on file  . Alcohol Use: Not on file  . Drug Use: Not on file  . Sexual Activity: Not on file   Other Topics Concern  . Not on file   Social History Narrative     Review of Systems: General: negative for chills, fever, night sweats or weight changes.  Cardiovascular: negative for chest pain, dyspnea on exertion, edema, orthopnea, palpitations, paroxysmal nocturnal dyspnea or shortness of breath Dermatological: negative for rash Respiratory: negative for cough or wheezing Urologic: negative for hematuria Abdominal: negative for nausea, vomiting,  diarrhea, bright red blood per rectum, melena, or hematemesis Neurologic: negative for visual changes, syncope, or dizziness All other systems reviewed and are otherwise negative except as noted above.    Blood pressure 120/80, pulse 74, height 5' 8.5" (1.74 m), weight 200 lb (90.719 kg).  General appearance: alert and no distress Neck: no adenopathy, no carotid bruit, no JVD, supple, symmetrical, trachea midline and thyroid not enlarged, symmetric, no tenderness/mass/nodules Lungs: clear to auscultation bilaterally Heart: regular rate and rhythm, S1, S2 normal, no murmur, click, rub or gallop Extremities: extremities normal, atraumatic, no cyanosis or edema  EKG normal sinus rhythm at 74 without ST or T-wave changes. I personally reviewed this EKG  ASSESSMENT AND PLAN:   Hyperlipidemia LDL goal <70 History of hyperlipidemia on atorvastatin 40 mg a day. We will recheck a lipid and liver profile  HTN (hypertension) History of hypertension blood pressure measured today at 120/80. He is on carvedilol, lisinopril and Inspra. Continue current meds at current dosing  CAD - LAD DES 2010 History of CAD status post anterior wall myocardial infarction 05/14/08 couplet to by ventricular fibrillation. He was encouraged in shock requiring intra-aortic balloon pumping. He was stented with a 3 5 mm x 20 mm long drug-eluting stent with an EF initially of 45% with an anteroapical wall motion abnormality.Marland Kitchen He changed his lifestyle, stop smoking and began exercising. A subsequent Myoview showed significant salvage myocardium without scar and a 2-D echo performed 02/25/13 revealed normal LV systolic function without wall motion under Melanie's. He denies chest pain or shortness of breath.      Runell Gess MD FACP,FACC,FAHA, [facility] 09/04/2014 9:17 AM

## 2014-09-04 NOTE — Assessment & Plan Note (Signed)
History of hypertension blood pressure measured today at 120/80. He is on carvedilol, lisinopril and Inspra. Continue current meds at current dosing

## 2014-09-04 NOTE — Patient Instructions (Signed)
  We will see you back in follow up in 1 year with Dr Berry.  Dr Berry has ordered: A FASTING lipid profile: to be done at your convenience.  There is a [facility] lab on the first floor of this building, [address].  They are open from 8am-5pm with a lunch from 12-2.  You do not need an appointment.      

## 2014-09-04 NOTE — Assessment & Plan Note (Signed)
History of hyperlipidemia on atorvastatin 40 mg a day. We will recheck a lipid and liver profile 

## 2014-09-04 NOTE — Assessment & Plan Note (Signed)
History of CAD status post anterior wall myocardial infarction 05/14/08 couplet to by ventricular fibrillation. He was encouraged in shock requiring intra-aortic balloon pumping. He was stented with a 3 5 mm x 20 mm long drug-eluting stent with an EF initially of 45% with an anteroapical wall motion abnormality.Marland Kitchen He changed his lifestyle, stop smoking and began exercising. A subsequent Myoview showed significant salvage myocardium without scar and a 2-D echo performed 02/25/13 revealed normal LV systolic function without wall motion under Melanie's. He denies chest pain or shortness of breath.

## 2014-09-06 ENCOUNTER — Encounter: Payer: Self-pay | Admitting: *Deleted

## 2015-06-10 ENCOUNTER — Telehealth: Payer: Self-pay | Admitting: Cardiovascular Disease

## 2015-06-10 NOTE — Telephone Encounter (Signed)
Pt setup for 1 year appt  In 09/2015  Also pt told there are no indications from notes stating that he cannot skydive. Pt stated he is not sure he is going to do it but wanted to see if is okay if he decides to.  No additional questions at this time.

## 2015-06-10 NOTE — Telephone Encounter (Signed)
Pt wants to know if it's of to go skydiving? pls call

## 2015-06-10 NOTE — Telephone Encounter (Signed)
LMTCB

## 2015-08-29 ENCOUNTER — Other Ambulatory Visit: Payer: Self-pay | Admitting: Cardiovascular Disease

## 2015-08-29 NOTE — Telephone Encounter (Signed)
REFILL 

## 2015-09-04 ENCOUNTER — Encounter: Payer: Self-pay | Admitting: Cardiovascular Disease

## 2015-09-04 ENCOUNTER — Other Ambulatory Visit: Payer: Self-pay | Admitting: *Deleted

## 2015-09-04 ENCOUNTER — Ambulatory Visit (INDEPENDENT_AMBULATORY_CARE_PROVIDER_SITE_OTHER): Payer: 59 | Admitting: Cardiovascular Disease

## 2015-09-04 VITALS — BP 130/90 | HR 81 | Ht 68.0 in | Wt 200.0 lb

## 2015-09-04 DIAGNOSIS — Z79899 Other long term (current) drug therapy: Secondary | ICD-10-CM

## 2015-09-04 DIAGNOSIS — I2510 Atherosclerotic heart disease of native coronary artery without angina pectoris: Secondary | ICD-10-CM

## 2015-09-04 DIAGNOSIS — I10 Essential (primary) hypertension: Secondary | ICD-10-CM

## 2015-09-04 DIAGNOSIS — E785 Hyperlipidemia, unspecified: Secondary | ICD-10-CM

## 2015-09-04 MED ORDER — EPLERENONE 25 MG PO TABS: 25.0000 mg | ORAL_TABLET | Freq: Every day | ORAL | 3 refills | Status: DC

## 2015-09-04 MED ORDER — NITROGLYCERIN 0.4 MG SL SUBL: 0.4000 mg | SUBLINGUAL_TABLET | SUBLINGUAL | 3 refills | Status: DC | PRN

## 2015-09-04 MED ORDER — LISINOPRIL 2.5 MG PO TABS: 2.5000 mg | ORAL_TABLET | Freq: Every day | ORAL | 3 refills | Status: DC

## 2015-09-04 MED ORDER — CARVEDILOL 3.125 MG PO TABS: 3.1250 mg | ORAL_TABLET | Freq: Two times a day (BID) | ORAL | 3 refills | Status: DC

## 2015-09-04 MED ORDER — PANTOPRAZOLE SODIUM 40 MG PO TBEC: 40.0000 mg | DELAYED_RELEASE_TABLET | Freq: Every day | ORAL | 3 refills | Status: DC

## 2015-09-04 MED ORDER — ATORVASTATIN CALCIUM 40 MG PO TABS: 40.0000 mg | ORAL_TABLET | Freq: Every day | ORAL | 3 refills | Status: DC

## 2015-09-04 MED ORDER — CLOPIDOGREL BISULFATE 75 MG PO TABS: 75.0000 mg | ORAL_TABLET | Freq: Every day | ORAL | 3 refills | Status: DC

## 2015-09-04 NOTE — Progress Notes (Signed)
09/04/2015 [name]   16-Nov-1955  [id number]  Primary Physician Devra Dopp, MD Primary Cardiologist: Runell Gess MD Nicholes Calamity, [place]  HPI:  The patient is a very pleasant 60 year old, mildly overweight, married Caucasian male, father of 3 living children (1 son was killed in [place] July 22, 2004, by a sniper,) grandfather to 4 grandchildren and 4 step-grandchildren who I last saw in the office 09/04/14. He suffered an anterior wall myocardial infarction May 14, 2008, complicated by ventricular fibrillation requiring defibrillation. He had cardiogenic shock requiring intraaortic balloon pumping. He was stented with a 3.5 x 20 mm long Xience drug-eluting stent. His EF at that time was 45% with anteroapical wall motion abnormality. Since that time his diet has changed, he stopped smoking and he exercises. His EF improved and followup Myoview performed several months later showed significant salvage of myocardium with no evidence of scar. His last lipid profile performed 09/04/14 revealed total cholesterol of 149, LDL 73 and HDL 48. Since I saw him a year ago he's remained asymptomatic. He did, interestingly, recently jumped out of a perfectly good  plane with a parachute.  Current Outpatient Prescriptions  Medication Sig Dispense Refill  . acetaminophen (TYLENOL) 650 MG CR tablet Take 650 mg by mouth every 8 (eight) hours as needed for pain.    Marland Kitchen acyclovir (ZOVIRAX) 400 MG tablet Take 400 mg by mouth 3 (three) times daily as needed.    Marland Kitchen albuterol (PROVENTIL HFA;VENTOLIN HFA) 108 (90 BASE) MCG/ACT inhaler Inhale 2 puffs into the lungs every 6 (six) hours as needed for wheezing.    Marland Kitchen aspirin EC 81 MG tablet Take 162 mg by mouth daily.    Marland Kitchen atorvastatin (LIPITOR) 40 MG tablet Take 1 tablet (40 mg total) by mouth daily. 90 tablet 3  . benzonatate (TESSALON) 100 MG capsule Take 100 mg by mouth 4 (four) times daily.    . carvedilol (COREG) 3.125 MG tablet Take 1 tablet (3.125 mg  total) by mouth 2 (two) times daily with a meal. 180 tablet 3  . cetirizine (ZYRTEC) 10 MG tablet Take 10 mg by mouth daily.    . clopidogrel (PLAVIX) 75 MG tablet Take 1 tablet (75 mg total) by mouth daily. 90 tablet 3  . Coenzyme Q10 (CO Q-10 MAXIMUM STRENGTH PO) Take 250 mg by mouth daily.    Marland Kitchen eplerenone (INSPRA) 25 MG tablet Take 1 tablet (25 mg total) by mouth daily. 90 tablet 3  . lisinopril (PRINIVIL,ZESTRIL) 2.5 MG tablet Take 1 tablet (2.5 mg total) by mouth daily. 90 tablet 3  . Multiple Vitamin (MULTIVITAMIN) tablet Take 1 tablet by mouth daily.    . nitroGLYCERIN (NITROLINGUAL) 0.4 MG/SPRAY spray Place 1 spray under the tongue every 5 (five) minutes x 3 doses as needed for chest pain. 12 g 12  . oxymetazoline (AFRIN) 0.05 % nasal spray Place 1 spray into both nostrils 4 (four) times daily as needed for congestion.    . pantoprazole (PROTONIX) 40 MG tablet Take 1 tablet (40 mg total) by mouth daily. KEEP OV. 90 tablet 0  . fluticasone (FLONASE) 50 MCG/ACT nasal spray Place 2 sprays into the nose as needed.     . sertraline (ZOLOFT) 50 MG tablet Take 50 mg by mouth.     No current facility-administered medications for this visit.     Allergies  Allergen Reactions  . Demeclocycline Hives  . Penicillins Hives and Swelling    Social History   Social History  .  Marital status: Married    Spouse name: N/A  . Number of children: N/A  . Years of education: N/A   Occupational History  . Not on file.   Social History Main Topics  . Smoking status: Former Smoker    Quit date: 05/23/2008  . Smokeless tobacco: Former Neurosurgeon  . Alcohol use Not on file  . Drug use: Unknown  . Sexual activity: Not on file   Other Topics Concern  . Not on file   Social History Narrative  . No narrative on file     Review of Systems: General: negative for chills, fever, night sweats or weight changes.  Cardiovascular: negative for chest pain, dyspnea on exertion, edema, orthopnea,  palpitations, paroxysmal nocturnal dyspnea or shortness of breath Dermatological: negative for rash Respiratory: negative for cough or wheezing Urologic: negative for hematuria Abdominal: negative for nausea, vomiting, diarrhea, bright red blood per rectum, melena, or hematemesis Neurologic: negative for visual changes, syncope, or dizziness All other systems reviewed and are otherwise negative except as noted above.    Blood pressure 130/90, pulse 81, height 5\' 8"  (1.727 m), weight 200 lb (90.7 kg).  General appearance: alert and no distress Neck: no adenopathy, no carotid bruit, no JVD, supple, symmetrical, trachea midline and thyroid not enlarged, symmetric, no tenderness/mass/nodules Lungs: clear to auscultation bilaterally Heart: regular rate and rhythm, S1, S2 normal, no murmur, click, rub or gallop Extremities: extremities normal, atraumatic, no cyanosis or edema  EKG normal sinus rhythm at 81. Changes. I personally reviewed this EKG  ASSESSMENT AND PLAN:   CAD - LAD DES 2010 History of CAD status post anterior wall myocardial infarction  05/14/08, and by ventricular fibrillation requiring defibrillation. He had cardiogenic shock requiring intra-aortic balloon pumping. He was stented with a 3.5 x 20 mm long Xiencedrug-eluting stent. His EF at that time was 45% with anteroapical wall motion abnormality. Subsequent improved by echo and Myoview. He is asymptomatic.  Hyperlipidemia LDL goal <70 History of hyperlipidemia on statin therapy. His last lipid profile was performed one year ago. We will recheck a lipid and liver profile  HTN (hypertension) History of hypertension blood pressure measured 120/80. He is on Inspra, lisinopril and carvedilol. Continue current meds at current dosing      Runell Gess MD [facility], [facility] 09/04/2015 2:11 PM

## 2015-09-04 NOTE — Telephone Encounter (Signed)
Patient requested refills after office visit and to be sent to [facility] pharmacy.

## 2015-09-04 NOTE — Assessment & Plan Note (Signed)
History of CAD status post anterior wall myocardial infarction  05/14/08, and by ventricular fibrillation requiring defibrillation. He had cardiogenic shock requiring intra-aortic balloon pumping. He was stented with a 3.5 x 20 mm long Xiencedrug-eluting stent. His EF at that time was 45% with anteroapical wall motion abnormality. Subsequent improved by echo and Myoview. He is asymptomatic.

## 2015-09-04 NOTE — Assessment & Plan Note (Signed)
History of hyperlipidemia on statin therapy. His last lipid profile was performed one year ago. We will recheck a lipid and liver profile

## 2015-09-04 NOTE — Assessment & Plan Note (Signed)
History of hypertension blood pressure measured 120/80. He is on Inspra, lisinopril and carvedilol. Continue current meds at current dosing

## 2015-09-04 NOTE — Patient Instructions (Signed)
Medication Instructions:  Your physician recommends that you continue on your current medications as directed. Please refer to the Current Medication list given to you today.   Labwork: Your physician recommends that you return for lab work AT YOUR EARLIEST CONVENIENCE. You will need to be fasting. The lab can be found on the FIRST FLOOR of out building in [address]   Follow-Up: Your physician wants you to follow-up in: 12 MONTHS WITH DR BERRY. You will receive a reminder letter in the mail two months in advance. If you don't receive a letter, please call our office to schedule the follow-up appointment.  If you need a refill on your cardiac medications before your next appointment, please call your pharmacy.   

## 2015-09-05 LAB — LIPID PANEL
Cholesterol: 120 mg/dL — ABNORMAL LOW (ref 125–200)
HDL: 42 mg/dL (ref >=40)
LDL Cholesterol: 56 mg/dL (ref <130)
Total CHOL/HDL Ratio: 2.9 Ratio (ref <=5.0)
Triglycerides: 112 mg/dL (ref <150)
VLDL: 22 mg/dL (ref <30)

## 2015-09-05 LAB — HEPATIC FUNCTION PANEL
ALBUMIN: 4.2 g/dL (ref 3.6–5.1)
ALT: 22 U/L (ref 9–46)
AST: 18 U/L (ref 10–35)
Alkaline Phosphatase: 109 U/L (ref 40–115)
Bilirubin, Direct: 0.1 mg/dL (ref <=0.2)
Indirect Bilirubin: 0.3 mg/dL (ref 0.2–1.2)
TOTAL PROTEIN: 6.9 g/dL (ref 6.1–8.1)
Total Bilirubin: 0.4 mg/dL (ref 0.2–1.2)

## 2015-11-25 ENCOUNTER — Other Ambulatory Visit: Payer: Self-pay | Admitting: Cardiovascular Disease

## 2015-11-25 NOTE — Telephone Encounter (Signed)
Rx request sent to pharmacy.  

## 2015-11-27 IMAGING — CR DG CHEST 2V
2 series · 2 of 2 positions shown · non-contrast
Comparison: 02/14/2009

CLINICAL DATA: Left side chest pain, weakness

EXAM:
CHEST  2 VIEW

[w chest pa]
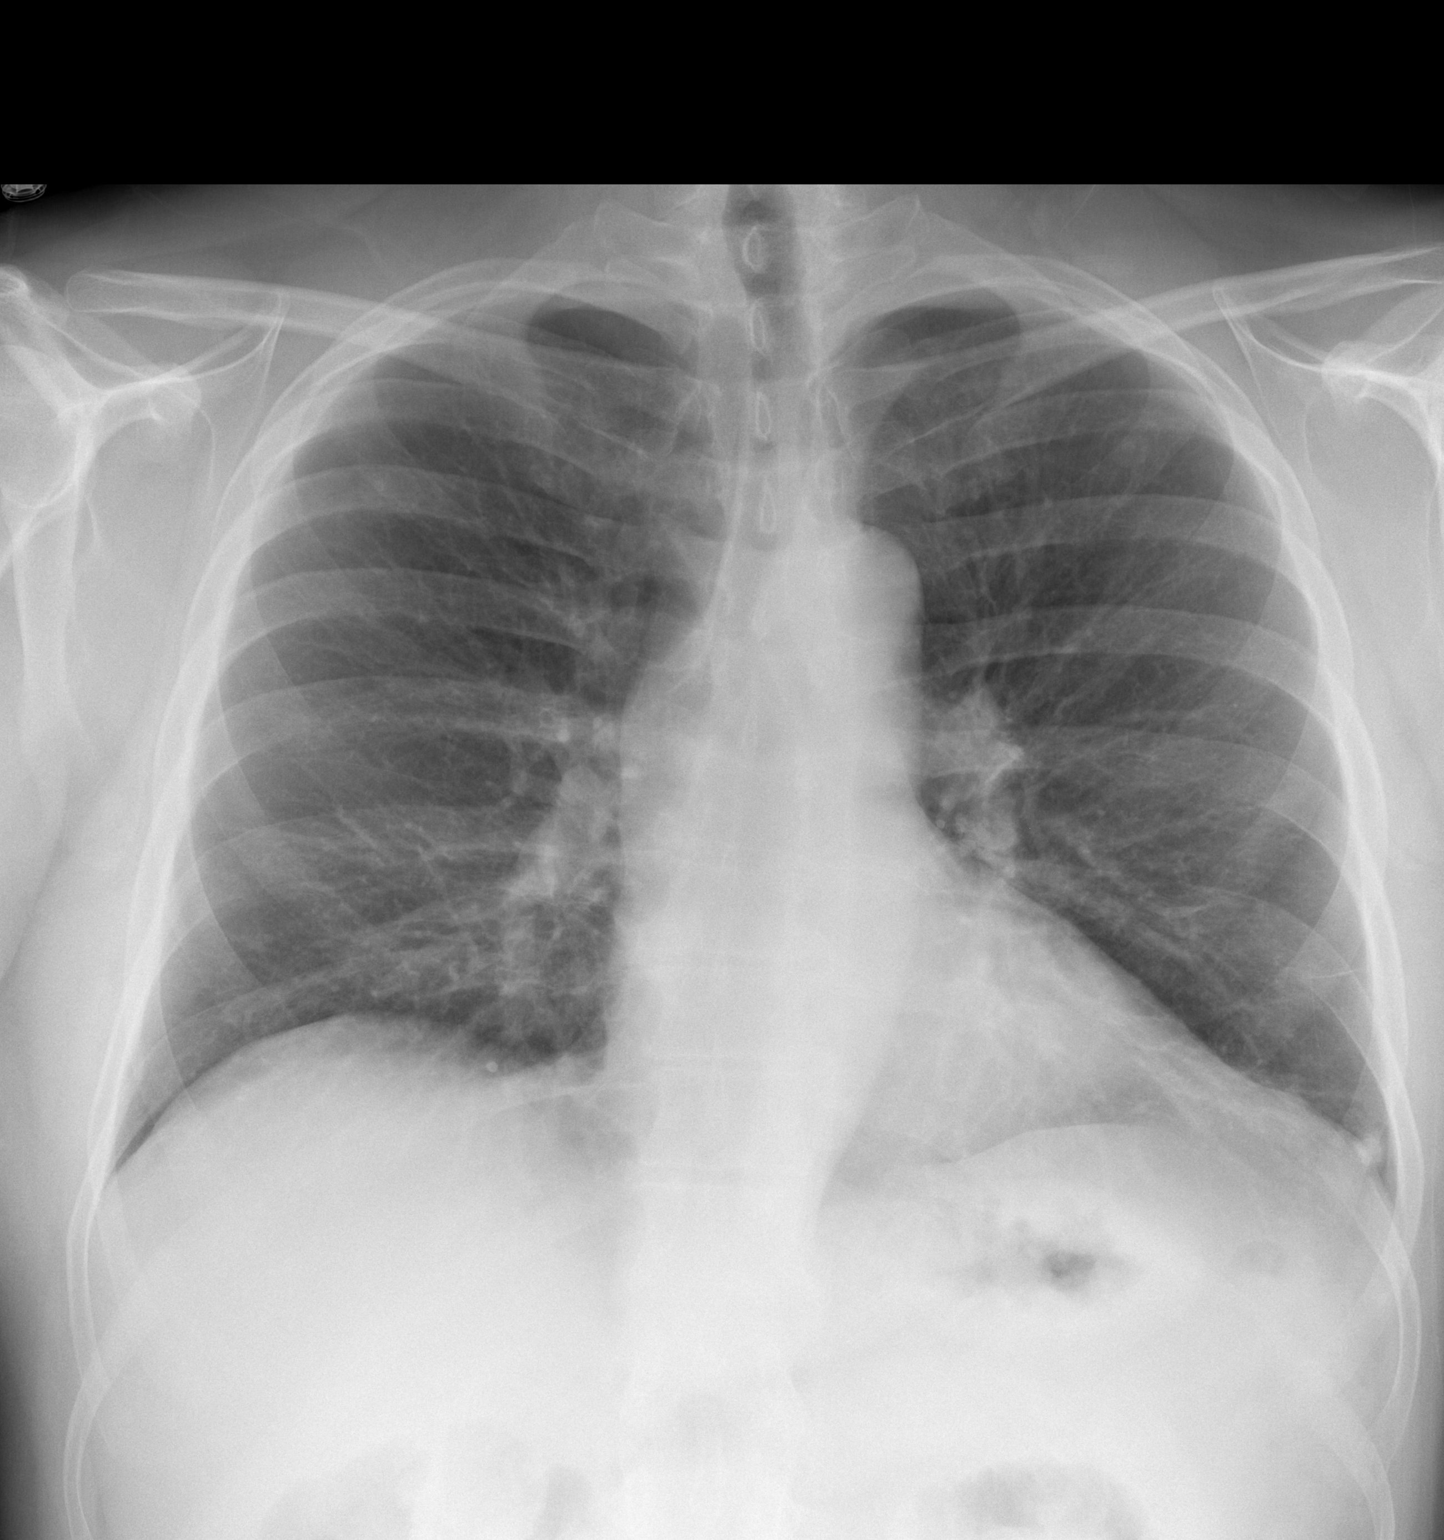

[w chest lat]
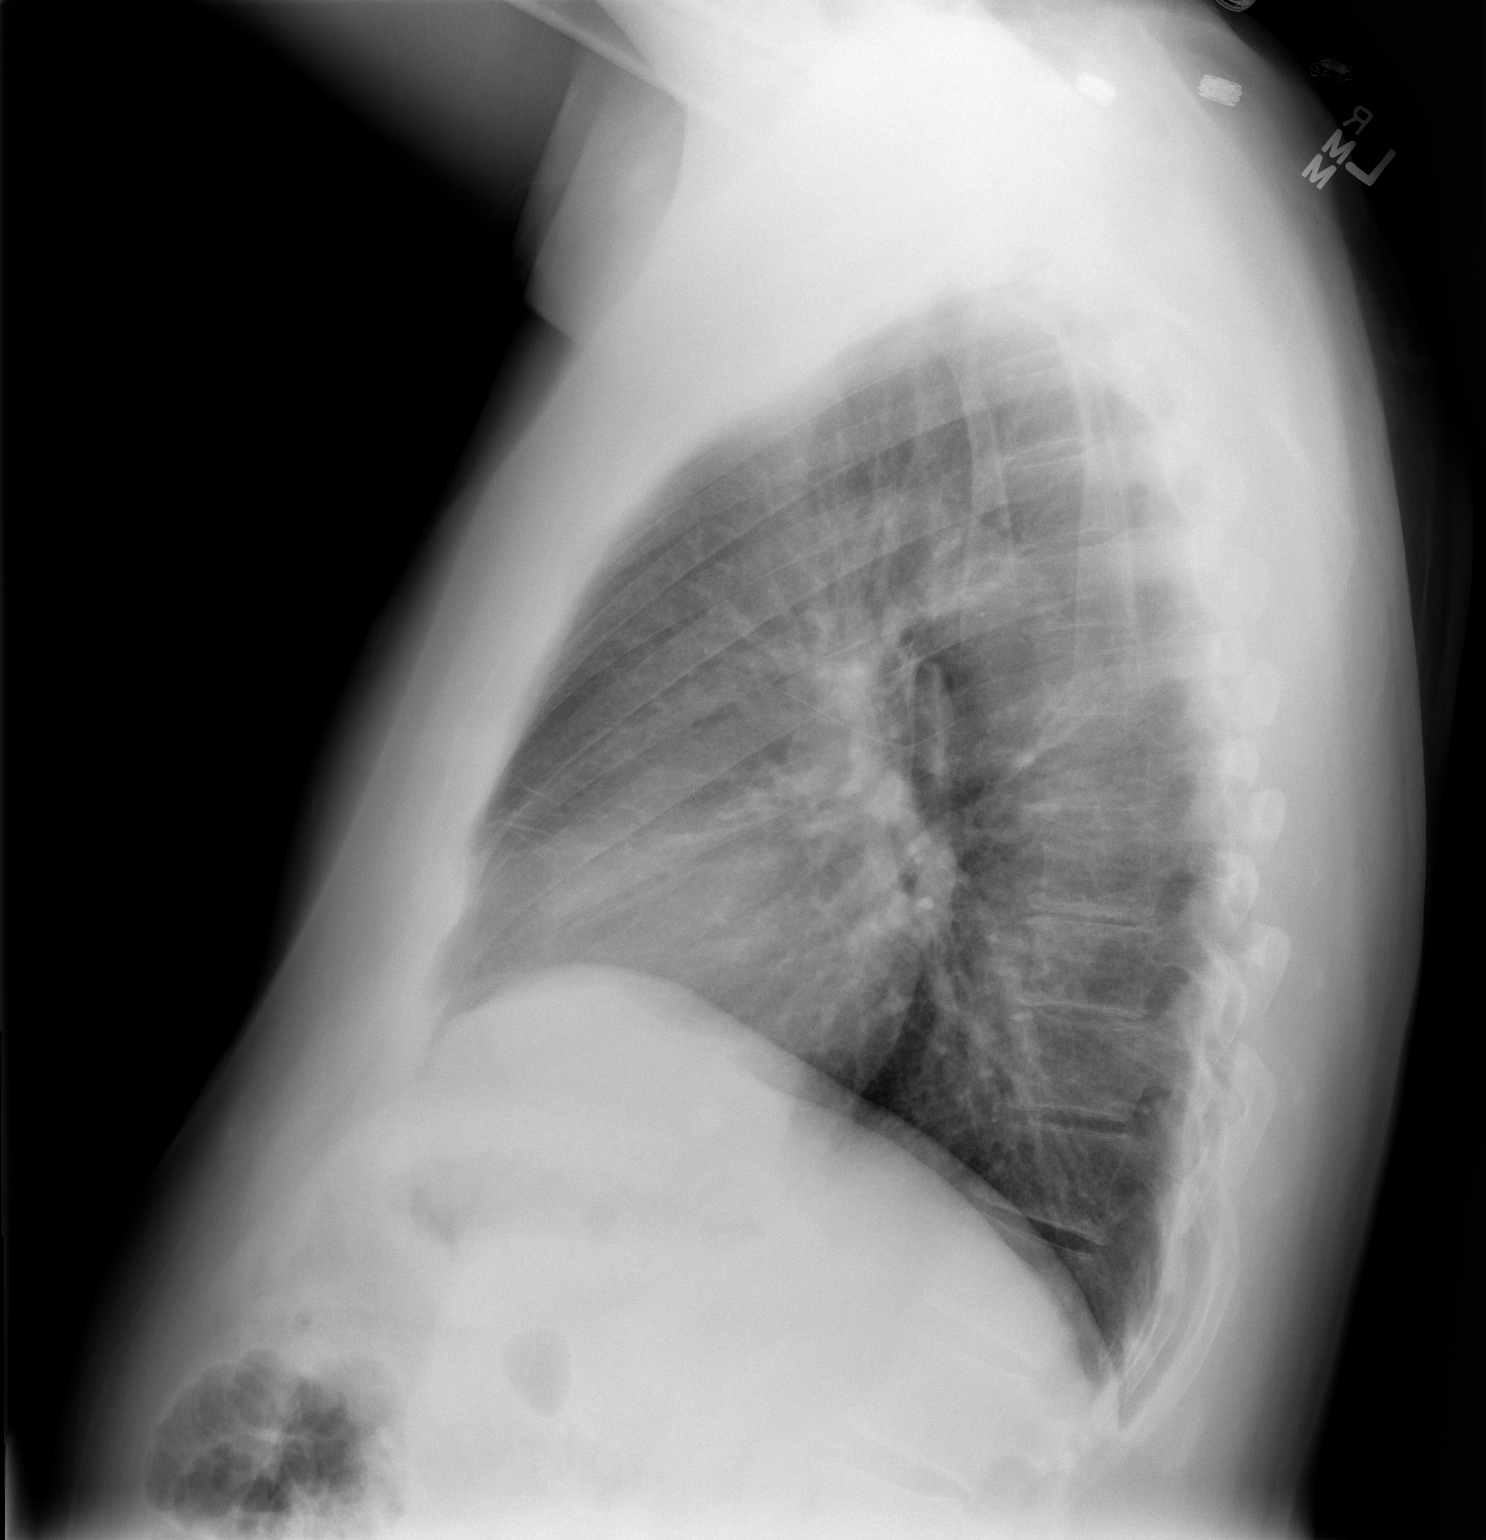

[2 of 2 positions shown; findings below may reference images not displayed]

FINDINGS: Cardiomediastinal silhouette is stable. No acute infiltrate or
pleural effusion. No pulmonary edema. Stable calcified granuloma
left base laterally. Mild degenerative changes thoracic spine.
IMPRESSION: No active cardiopulmonary disease.

## 2016-06-19 ENCOUNTER — Other Ambulatory Visit: Payer: Self-pay | Admitting: Cardiovascular Disease

## 2016-08-18 ENCOUNTER — Other Ambulatory Visit: Payer: Self-pay | Admitting: Cardiovascular Disease

## 2016-08-18 NOTE — Telephone Encounter (Signed)
Rx has been sent to the pharmacy electronically. ° °

## 2016-09-04 ENCOUNTER — Ambulatory Visit (INDEPENDENT_AMBULATORY_CARE_PROVIDER_SITE_OTHER): Payer: 59 | Admitting: Cardiovascular Disease

## 2016-09-04 ENCOUNTER — Encounter: Payer: Self-pay | Admitting: Cardiovascular Disease

## 2016-09-04 VITALS — BP 108/78 | HR 64 | Ht 69.0 in | Wt 209.0 lb

## 2016-09-04 DIAGNOSIS — I10 Essential (primary) hypertension: Secondary | ICD-10-CM

## 2016-09-04 DIAGNOSIS — E785 Hyperlipidemia, unspecified: Secondary | ICD-10-CM

## 2016-09-04 DIAGNOSIS — I2510 Atherosclerotic heart disease of native coronary artery without angina pectoris: Secondary | ICD-10-CM | POA: Diagnosis not present

## 2016-09-04 LAB — HEPATIC FUNCTION PANEL
ALT: 35 IU/L (ref 0–44)
AST: 20 IU/L (ref 0–40)
Albumin: 4.6 g/dL (ref 3.6–4.8)
Alkaline Phosphatase: 108 IU/L (ref 39–117)
BILIRUBIN TOTAL: 0.4 mg/dL (ref 0.0–1.2)
Bilirubin, Direct: 0.16 mg/dL (ref 0.00–0.40)
Total Protein: 6.9 g/dL (ref 6.0–8.5)

## 2016-09-04 LAB — LIPID PANEL
CHOL/HDL RATIO: 3.2 ratio (ref 0.0–5.0)
Cholesterol, Total: 142 mg/dL (ref 100–199)
HDL: 44 mg/dL (ref >39)
LDL CALC: 66 mg/dL (ref 0–99)
TRIGLYCERIDES: 161 mg/dL — AB (ref 0–149)
VLDL CHOLESTEROL CAL: 32 mg/dL (ref 5–40)

## 2016-09-04 MED ORDER — CLOPIDOGREL BISULFATE 75 MG PO TABS: 75.0000 mg | ORAL_TABLET | Freq: Every day | ORAL | 3 refills | Status: DC

## 2016-09-04 MED ORDER — LISINOPRIL 2.5 MG PO TABS: 2.5000 mg | ORAL_TABLET | Freq: Every day | ORAL | 3 refills | Status: DC

## 2016-09-04 MED ORDER — PANTOPRAZOLE SODIUM 40 MG PO TBEC: 40.0000 mg | DELAYED_RELEASE_TABLET | Freq: Every day | ORAL | 3 refills | Status: DC

## 2016-09-04 MED ORDER — NITROGLYCERIN 0.4 MG SL SUBL: 0.4000 mg | SUBLINGUAL_TABLET | SUBLINGUAL | 3 refills | Status: DC | PRN

## 2016-09-04 MED ORDER — CARVEDILOL 3.125 MG PO TABS: 3.1250 mg | ORAL_TABLET | Freq: Two times a day (BID) | ORAL | 3 refills | Status: DC

## 2016-09-04 MED ORDER — ATORVASTATIN CALCIUM 40 MG PO TABS: 40.0000 mg | ORAL_TABLET | Freq: Every day | ORAL | 3 refills | Status: DC

## 2016-09-04 NOTE — Assessment & Plan Note (Signed)
History of essential hypertension with blood pressure measured at 108/78. He is on carvedilol, Inspra and lisinopril. He does measure his blood pressure frequently and states that on a digital APP on his SMART PHONE Continue current meds at current dosing.

## 2016-09-04 NOTE — Assessment & Plan Note (Signed)
History of hyperlipidemia on high-dose statin therapy with recent lipid profile performed 09/04/15 revealed total cholesterol 120, LDL 56 and HDL of 42. We will repeat a lipid and liver profile

## 2016-09-04 NOTE — Patient Instructions (Signed)

## 2016-09-04 NOTE — Addendum Note (Signed)
Addended by: Evans LanceSTOVER, Gerrod Maule W on: 09/04/2016 09:55 AM   Modules accepted: Orders

## 2016-09-04 NOTE — Assessment & Plan Note (Addendum)
History of CAD status post anterior wall myocardial infarction/12/10 complicated by ventricular fibrillation requiring defibrillation, cardiogenic shock requiring intra-aortic balloon pumping and ultimately stenting with a 3.5 x 20 mm long Xience drug-eluting stent. His EF initially was 45% and anteroapical wall motion under Melanie. Follow-up Myoview were field normalization of his ejection fraction completely myocardial salvage with no evidence of scar. He denies chest pain or shortness of breath.

## 2016-09-04 NOTE — Progress Notes (Signed)
09/04/2016 Scott RuizKevin A Jester   05/26/1955  [id number]  Primary Physician Devra DoppHowell, Tamieka, MD Primary Cardiologist: Runell GessJonathan J Berry MD FACP, [place], [place], [place]  HPI:  [name] is a 61 y.o. male  mildly overweight, married Caucasian male, father of 3 living children (1 son was killed in [place] July 22, 2004, by a sniper,) grandfather to 4 grandchildren and 4 step-grandchildren who I last saw in the office 09/04/15. He suffered an anterior wall myocardial infarction May 14, 2008, complicated by ventricular fibrillation requiring defibrillation. He had cardiogenic shock requiring intraaortic balloon pumping. He was stented with a 3.5 x 20 mm long Xience drug-eluting stent. His EF at that time was 45% with anteroapical wall motion abnormality. Since that time his diet has changed, he stopped smoking and he exercises. His EF improved and followup Myoview performed several months later showed significant salvage of myocardium with no evidence of scar. His last lipid profile performed 09/04/15 revealed total cholesterol 120, LDL 56 and HDL 42.. Since I saw him a year ago he's remained asymptomatic. He did, interestingly, jump  out of a perfectly good  plane with a parachute.    Current Meds  Medication Sig  . acetaminophen (TYLENOL) 650 MG CR tablet Take 650 mg by mouth every 8 (eight) hours as needed for pain.  Marland Kitchen. acyclovir (ZOVIRAX) 400 MG tablet Take 400 mg by mouth 3 (three) times daily as needed.  Marland Kitchen. albuterol (PROVENTIL HFA;VENTOLIN HFA) 108 (90 BASE) MCG/ACT inhaler Inhale 2 puffs into the lungs every 6 (six) hours as needed for wheezing.  Marland Kitchen. aspirin EC 81 MG tablet Take 81 mg by mouth daily.   Marland Kitchen. atorvastatin (LIPITOR) 40 MG tablet Take 1 tablet (40 mg total) by mouth daily.  . carvedilol (COREG) 3.125 MG tablet Take 1 tablet (3.125 mg total) by mouth 2 (two) times daily with a meal.  . cetirizine (ZYRTEC) 10 MG tablet Take 10 mg by mouth daily.  . clopidogrel (PLAVIX) 75 MG tablet Take 1 tablet (75  mg total) by mouth daily.  . Coenzyme Q10 (CO Q-10 MAXIMUM STRENGTH PO) Take 250 mg by mouth daily.  Marland Kitchen. eplerenone (INSPRA) 25 MG tablet TAKE 1 TABLET DAILY  . fluticasone (FLONASE) 50 MCG/ACT nasal spray Place 2 sprays into the nose as needed.   Marland Kitchen. lisinopril (PRINIVIL,ZESTRIL) 2.5 MG tablet Take 1 tablet (2.5 mg total) by mouth daily.  . Multiple Vitamin (MULTIVITAMIN) tablet Take 1 tablet by mouth daily.  . nitroGLYCERIN (NITROSTAT) 0.4 MG SL tablet DISSOLVE 1 TABLET UNDER THETONGUE EVERY 5 MINUTES AS  NEEDED FOR CHEST PAIN  . oxymetazoline (AFRIN) 0.05 % nasal spray Place 1 spray into both nostrils 4 (four) times daily as needed for congestion.  . pantoprazole (PROTONIX) 40 MG tablet TAKE 1 TABLET EVERY DAY, KEEP OV.  . sertraline (ZOLOFT) 25 MG tablet Take 25 mg by mouth.      Allergies  Allergen Reactions  . Demeclocycline Hives  . Penicillins Hives and Swelling    Social History   Social History  . Marital status: Married    Spouse name: N/A  . Number of children: N/A  . Years of education: N/A   Occupational History  . Not on file.   Social History Main Topics  . Smoking status: Former Smoker    Quit date: 05/23/2008  . Smokeless tobacco: Former NeurosurgeonUser  . Alcohol use Not on file  . Drug use: Unknown  . Sexual activity: Not on file   Other  Topics Concern  . Not on file   Social History Narrative  . No narrative on file     Review of Systems: General: negative for chills, fever, night sweats or weight changes.  Cardiovascular: negative for chest pain, dyspnea on exertion, edema, orthopnea, palpitations, paroxysmal nocturnal dyspnea or shortness of breath Dermatological: negative for rash Respiratory: negative for cough or wheezing Urologic: negative for hematuria Abdominal: negative for nausea, vomiting, diarrhea, bright red blood per rectum, melena, or hematemesis Neurologic: negative for visual changes, syncope, or dizziness All other systems reviewed and are  otherwise negative except as noted above.    Blood pressure 108/78, pulse 64, height 5\' 9"  (1.753 m), weight 209 lb (94.8 kg).  General appearance: alert and no distress Neck: no adenopathy, no carotid bruit, no JVD, supple, symmetrical, trachea midline and thyroid not enlarged, symmetric, no tenderness/mass/nodules Lungs: clear to auscultation bilaterally Heart: regular rate and rhythm, S1, S2 normal, no murmur, click, rub or gallop Extremities: extremities normal, atraumatic, no cyanosis or edema  EKG sinus rhythm at 64 with an ST or T-wave changes. I Personally reviewed this EKG.  ASSESSMENT AND PLAN:   CAD - LAD DES 2010 History of CAD status post anterior wall myocardial infarction/12/10 complicated by ventricular fibrillation requiring defibrillation, cardiogenic shock requiring intra-aortic balloon pumping and ultimately stenting with a 3.5 x 20 mm long Xience drug-eluting stent. His EF initially was 45% and anteroapical wall motion under Melanie. Follow-up Myoview were field normalization of his ejection fraction completely myocardial salvage with no evidence of scar. He denies chest pain or shortness of breath.  Hyperlipidemia LDL goal <70 History of hyperlipidemia on high-dose statin therapy with recent lipid profile performed 09/04/15 revealed total cholesterol 120, LDL 56 and HDL of 42. We will repeat a lipid and liver profile  HTN (hypertension) History of essential hypertension with blood pressure measured at 108/78. He is on carvedilol, Inspra and lisinopril. He does measure his blood pressure frequently and states that on a digital APP on his SMART PHONE Continue current meds at current dosing.      Runell GessJonathan J. Berry MD FACP,FACC,FAHA, [facility] 09/04/2016 8:06 AM

## 2016-09-15 ENCOUNTER — Other Ambulatory Visit: Payer: Self-pay | Admitting: Cardiovascular Disease

## 2016-09-15 MED ORDER — CLOPIDOGREL BISULFATE 75 MG PO TABS: 75.0000 mg | ORAL_TABLET | Freq: Every day | ORAL | 3 refills | Status: DC

## 2016-09-15 MED ORDER — CARVEDILOL 3.125 MG PO TABS: 3.1250 mg | ORAL_TABLET | Freq: Two times a day (BID) | ORAL | 3 refills | Status: DC

## 2016-09-15 MED ORDER — ATORVASTATIN CALCIUM 40 MG PO TABS: 40.0000 mg | ORAL_TABLET | Freq: Every day | ORAL | 3 refills | Status: DC

## 2016-09-15 MED ORDER — LISINOPRIL 2.5 MG PO TABS: 2.5000 mg | ORAL_TABLET | Freq: Every day | ORAL | 3 refills | Status: DC

## 2016-09-15 MED ORDER — NITROGLYCERIN 0.4 MG SL SUBL: 0.4000 mg | SUBLINGUAL_TABLET | SUBLINGUAL | 3 refills | Status: DC | PRN

## 2016-09-15 MED ORDER — PANTOPRAZOLE SODIUM 40 MG PO TBEC: 40.0000 mg | DELAYED_RELEASE_TABLET | Freq: Every day | ORAL | 3 refills | Status: DC

## 2016-09-15 NOTE — Telephone Encounter (Signed)
Spoke with patient and he stated when he was seen last week he gave us the wrong mail order pharmacy and wanted to inform us that the correct pharmacy for his rx's is [facility]. He also stated that he needed refills on his meds. Informed him that meds werre sent over to [facility]. Patient voiced understanding.

## 2016-09-15 NOTE — Telephone Encounter (Signed)
Lmtcb.

## 2016-09-15 NOTE — Telephone Encounter (Signed)
New message   They were sent to wrong pharmacy last week    *STAT* If patient is at the pharmacy, call can be transferred to refill team.   1. Which medications need to be refilled? (please list name of each medication and dose if known)   atorvastatin (LIPITOR) 40 MG tablet Take 1 tablet (40 mg total) by mouth daily.    carvedilol (COREG) 3.125 MG tablet Take 1 tablet (3.125 mg total) by mouth 2 (two) times daily with a meal.   clopidogrel (PLAVIX) 75 MG tablet Take 1 tablet (75 mg total) by mouth daily.   eplerenone (INSPRA) 25 MG tablet TAKE 1 TABLET DAILY   lisinopril (PRINIVIL,ZESTRIL) 2.5 MG tablet Take 1 tablet (2.5 mg total) by mouth daily   nitroGLYCERIN (NITROSTAT) 0.4 MG SL tablet Place 1 tablet (0.4 mg total) under the tongue every 5 (five) minutes as needed for chest pain.   pantoprazole (PROTONIX) 40 MG tablet Take 1 tablet (40 mg total) by mouth daily      2. Which pharmacy/location (including street and city if local pharmacy) is medication to be sent to? [facility]   3. Do they need a 30 day or 90 day supply?  90

## 2016-09-15 NOTE — Telephone Encounter (Signed)
[name] is calling about his medication , gave the wrong information on last week . Please call

## 2016-11-16 ENCOUNTER — Other Ambulatory Visit: Payer: Self-pay | Admitting: Cardiovascular Disease

## 2017-01-10 ENCOUNTER — Other Ambulatory Visit: Payer: Self-pay | Admitting: Cardiovascular Disease

## 2017-04-09 ENCOUNTER — Telehealth: Payer: Self-pay | Admitting: Cardiovascular Disease

## 2017-04-09 MED ORDER — EPLERENONE 25 MG PO TABS: 25.0000 mg | ORAL_TABLET | Freq: Every day | ORAL | 0 refills | Status: DC

## 2017-04-09 NOTE — Telephone Encounter (Signed)
New message      *STAT* If patient is at the pharmacy, call can be transferred to refill team.   1. Which medications need to be refilled? (please list name of each medication and dose if known) eplerenone (INSPRA) 25 MG tablet  2. Which pharmacy/location (including street and city if local pharmacy) [facility] [address]   3. Do they need a 30 day or 90 day supply? Waiting on mailorder

## 2017-04-09 NOTE — Telephone Encounter (Signed)
Follow up     Patient called back his prescription has not been called in yet , he needs it for the weekend

## 2017-04-09 NOTE — Telephone Encounter (Signed)
[facility]plerenone has been [facility]. 14 day supply until his mail order arrives.

## 2017-04-09 NOTE — Telephone Encounter (Signed)
F/U Call:  [facility], states that patient needs refill for medication for a partial fill.  Details listed below

## 2017-05-14 ENCOUNTER — Telehealth: Payer: Self-pay | Admitting: Cardiovascular Disease

## 2017-05-14 NOTE — Telephone Encounter (Signed)
   Primary Cardiologist:[name], MD  Chart reviewed as part of pre-operative protocol coverage. Because of Scott DevoidKevin A Rush's past medical history and time since last visit, he/she will require a follow-up visit in order to better assess preoperative cardiovascular risk. Last OV was > 6 months ago. He has h/o anterior MI with subsequent VF cardiac arrest and had stenting to the LAD.   Pre-op covering staff: - Please schedule appointment and call patient to inform them. - Please contact requesting surgeon's office via preferred method (i.e, phone, fax) to inform them of need for appointment prior to surgery.  [name], PA-C  05/14/2017, 1:56 PM

## 2017-05-14 NOTE — Telephone Encounter (Signed)
Called pt re: cardiac clearance.  Pt is aware that he would need to be seen before he could be cleared for surgery. Pt advised his insurance runs out the end of the month and that he needed this surgery before then. Per Boyce MediciBrittany Simmons, PA-C, scheduled pt to see Joni ReiningKathryn Lawrence, NP, 08/19/17. Pt thanked me for the call.

## 2017-05-14 NOTE — Telephone Encounter (Signed)
New message    This is a direct fax for Scott ParodySidney [phone] - fax response today if possible , they are wanting to get patient in next week if possible

## 2017-05-14 NOTE — Telephone Encounter (Signed)
New Message:        [facility] Pre-operative Risk Assessment    Request for surgical clearance:  1. What type of surgery is being performed? Open Left inguinal  2. When is this surgery scheduled? TBD   3. What type of clearance is required (medical clearance vs. Pharmacy clearance to hold med vs. Both)? Both  4. Are there any medications that need to be held prior to surgery and how long? aspirin EC 81 MG tablet clopidogrel (PLAVIX) 75 MG tablet. Stopped 5 days before the procedure  5. Practice name and name of physician performing surgery? Dr. [facility]  6. What is your office phone number  [phone] 7.   What is your office fax number [phone]  8.   Anesthesia type (None, local, MAC, general) ? General   Hernia is in the pt's bladder so this is something that needs to happen quickly   [name] 05/14/2017, 8:18 AM  _________________________________________________________________   (provider comments below)

## 2017-05-14 NOTE — Telephone Encounter (Signed)
Follow up     Do you physically need to see the patient or can the patient have the hernia repair without being seen ? Would like a response today if possible ?

## 2017-05-19 NOTE — Progress Notes (Signed)
Cardiology Office Note   Date:  05/20/2017   ID:  [name], DOB 01/13/1956, [name]  PCP:  Devra DoppHowell, Tamieka, MD  [facility]  [facility]  Chief Complaint  Patient presents with  . Pre-op Exam  . Coronary Artery Disease     History of Present Illness: [name] is a 62 y.o. male who presents for ongoing assessment and management of CAD. He suffered an anterior wall myocardial infarction May 14, 2008, complicated by ventricular fibrillation requiring defibrillation. He had cardiogenic shock requiring intraaortic balloon pumping. He was stented with a 3.5 x 20 mm long Xience drug-eluting stent. His EF at that time was 45% with anteroapical wall motion abnormality. Since that time his diet has changed, he stopped smoking and he exercises. His EF improved and followup Myoview performed several months later showed significant salvage of myocardium with no evidence of scar.  He is here today for preoperative cardiac evaluation to repair a ventral hernia on the left as it is also incarcerating portion of his bladder.  The patient has been medically compliant, has stopped smoking, walks daily approximately 5 miles, denies any recurrent discomfort in his chest dyspnea on exertion or significant fatigue.  The patient logs is mild and blood pressures phone which she shows me today.  He follows Dr. Allyson SabalBerry annually and has been medically compliant.   Past Medical History:  Diagnosis Date  . Asthma   . CAD (coronary artery disease), native coronary artery   . H/O cardiac catheterization 05/2008   Xience DES to LAD, EF by Echo 08/08/08 >55%  . History of acute anterior wall MI 05/14/2008   wth v. fib & defibrillation, cardiogenic shock wth IABP,, EMERGENCY STENTING  . HTN (hypertension)   . Hyperlipidemia LDL goal <70     Past Surgical History:  Procedure Laterality Date  . CORONARY ANGIOPLASTY WITH STENT PLACEMENT  05/14/08   Xience DES to LAD-emergently  . HERNIA REPAIR  1980, 06/13/07      Current Outpatient Medications  Medication Sig Dispense Refill  . acetaminophen (TYLENOL) 650 MG CR tablet Take 650 mg by mouth every 8 (eight) hours as needed for pain.    Marland Kitchen. acyclovir (ZOVIRAX) 400 MG tablet Take 400 mg by mouth 3 (three) times daily as needed.    Marland Kitchen. albuterol (PROVENTIL HFA;VENTOLIN HFA) 108 (90 BASE) MCG/ACT inhaler Inhale 2 puffs into the lungs every 6 (six) hours as needed for wheezing.    Marland Kitchen. aspirin EC 81 MG tablet Take 81 mg by mouth daily.     Marland Kitchen. atorvastatin (LIPITOR) 40 MG tablet Take 1 tablet (40 mg total) by mouth daily. 90 tablet 3  . carvedilol (COREG) 3.125 MG tablet Take 1 tablet (3.125 mg total) by mouth 2 (two) times daily with a meal. 180 tablet 3  . cetirizine (ZYRTEC) 10 MG tablet Take 10 mg by mouth daily.    . clopidogrel (PLAVIX) 75 MG tablet Take 1 tablet (75 mg total) by mouth daily. 90 tablet 3  . Coenzyme Q10 (CO Q-10 MAXIMUM STRENGTH PO) Take 250 mg by mouth daily.    Marland Kitchen. eplerenone (INSPRA) 25 MG tablet Take 1 tablet (25 mg total) by mouth daily. 14 tablet 0  . lisinopril (PRINIVIL,ZESTRIL) 2.5 MG tablet Take 1 tablet (2.5 mg total) by mouth daily. 90 tablet 3  . Multiple Vitamin (MULTIVITAMIN) tablet Take 1 tablet by mouth daily.    . nitroGLYCERIN (NITROSTAT) 0.4 MG SL tablet Place 1 tablet (0.4 mg total) under the tongue  every 5 (five) minutes as needed for chest pain. 75 tablet 3  . oxymetazoline (AFRIN) 0.05 % nasal spray Place 1 spray into both nostrils 4 (four) times daily as needed for congestion.    . pantoprazole (PROTONIX) 40 MG tablet Take 1 tablet (40 mg total) by mouth daily. 90 tablet 3  . fluticasone (FLONASE) 50 MCG/ACT nasal spray Place 2 sprays into the nose as needed.     . sertraline (ZOLOFT) 25 MG tablet Take 25 mg by mouth.      No current facility-administered medications for this visit.     Allergies:   Demeclocycline and Penicillins    Social History:  The patient  reports that he quit smoking about 8 years ago. He  has quit using smokeless tobacco.   Family History:  The patient's family history includes Diabetes in his father; Hyperlipidemia in his mother and sister; Hypertension in his mother and sister.    ROS: All other systems are reviewed and negative. Unless otherwise mentioned in H&P    PHYSICAL EXAM: VS:  BP 115/72   Pulse 74   Ht 5\' 9"  (1.753 m)   Wt 212 lb (96.2 kg)   BMI 31.31 kg/m  , BMI Body mass index is 31.31 kg/m. GEN: Well nourished, well developed, in no acute distress obese  HEENT: normal  Neck: no JVD, carotid bruits, or masses Cardiac: RRR; no murmurs, rubs, or gallops,no edema  Respiratory:  Clear to auscultation bilaterally, normal work of breathing GI: soft, nontender, nondistended, + BS MS: no deformity or atrophy  Skin: warm and dry, no rash Neuro:  Strength and sensation are intact Psych: euthymic mood, full affect   EKG: Normal sinus rhythm heart rate of 74 bpm no evidence of ST-T wave abnormalities.  Recent Labs: 09/04/2016: ALT 35    Lipid Panel    Component Value Date/Time   CHOL 142 09/04/2016 0814   TRIG 161 (H) 09/04/2016 0814   HDL 44 09/04/2016 0814   CHOLHDL 3.2 09/04/2016 0814   CHOLHDL 2.9 09/04/2015 0815   VLDL 22 09/04/2015 0815   LDLCALC 66 09/04/2016 0814      Wt Readings from Last 3 Encounters:  05/20/17 212 lb (96.2 kg)  09/04/16 209 lb (94.8 kg)  09/04/15 200 lb (90.7 kg)      Other studies Reviewed: Echocardiogram 2013/07/02 Left ventricle: The cavity size was normal. Systolic function was normal. The estimated ejection fraction was 55%. Wall motion was normal; there were no regional wall motion abnormalities. There was an increased relative contribution of atrial contraction to ventricular filling. Doppler parameters are consistent with abnormal left ventricular relaxation (grade 1 diastolic dysfunction). - Aorta: Aortic root dimension: 39mm (ED). - Ascending aorta: The ascending aorta was mildly dilated. -  Mitral valve: Mild regurgitation.   ASSESSMENT AND PLAN:  1.  Preoperative cardiac evaluation: The patient has been doing well remains physically active walks up to 5 miles a day, has had no complaints of discomfort or dyspnea on exertion.  Blood pressure has been well maintained and he is medically compliant.  Chart reviewed as part of pre-operative protocol coverage. Given past medical history and time since last visit, based on ACC/AHA guidelines, [name] would be at acceptable risk for the planned procedure without further cardiovascular testing.   He will need to hold Plavix for 5 days prior to procedure.  The patient will need to restart Plavix as soon as possible postoperatively.   2.  Coronary artery disease: Known history  of ST elevation MI requiring DES to the left anterior descending artery.  The patient required defibrillation in the setting of cardiogenic shock and ventricular fibrillation during catheterization.  This did require IABP in 2010.  The patient asymptomatic remains active and medically compliant.  No change in regimen.  He requests to continue his annual visits with Dr. Allyson Sabal due in August.  Consideration for stopping Plavix therapy can be discussed by Dr. Allyson Sabal with the patient on that office visit.  3.  Hypercholesterolemia: Most recent labs were completed in August 2018.  He wishes to wait follow-up appointment for labs to be drawn.  He will be having some preoperative labs completed.  Most recent cholesterol study in August 2018 revealed cholesterol total of 142, HDL 44, LDL 66, triglycerides 161.  4. Hypertension: Very well controlled.  He brings with him a list of his blood pressures and heart rates which he keeps on his phone all of which have been very well controlled.  No changes in his medicine regimen.  Current medicines are reviewed at length with the patient today.    Labs/ tests ordered today include: None   Bettey Mare. Liborio Nixon, ANP, AACC     05/20/2017 12:14 PM    [facility] [address], [place], [place] [address] Phone: [phone]; Fax: [phone]

## 2017-05-20 ENCOUNTER — Ambulatory Visit (INDEPENDENT_AMBULATORY_CARE_PROVIDER_SITE_OTHER): Payer: 59 | Admitting: Adult Health

## 2017-05-20 ENCOUNTER — Encounter: Payer: Self-pay | Admitting: Adult Health

## 2017-05-20 ENCOUNTER — Telehealth: Payer: Self-pay | Admitting: Adult Health

## 2017-05-20 VITALS — BP 115/72 | HR 74 | Ht 69.0 in | Wt 212.0 lb

## 2017-05-20 DIAGNOSIS — E7800 Pure hypercholesterolemia, unspecified: Secondary | ICD-10-CM

## 2017-05-20 DIAGNOSIS — I10 Essential (primary) hypertension: Secondary | ICD-10-CM

## 2017-05-20 DIAGNOSIS — I2510 Atherosclerotic heart disease of native coronary artery without angina pectoris: Secondary | ICD-10-CM | POA: Diagnosis not present

## 2017-05-20 DIAGNOSIS — Z01810 Encounter for preprocedural cardiovascular examination: Secondary | ICD-10-CM | POA: Diagnosis not present

## 2017-05-20 NOTE — Telephone Encounter (Signed)
New Message   Crystal from Dr. Dennis Bastickeys office is calling about patient appointment today. It was for a cardiac clearance. She needs the notes from this appointment to be sent to her so that the appointment can be scheduled. Please fax to [phone].

## 2017-05-20 NOTE — Patient Instructions (Signed)
Medication Instructions:  NO CHANGES- Your physician recommends that you continue on your current medications as directed. Please refer to the Current Medication list given to you today.  If you need a refill on your cardiac medications before your next appointment, please call your pharmacy.  Special Instructions:  CLEARED FOR LEFT OPEN INGUINAL HERNIA REPAIR WITH MESH WITH Dr. Noralee Charsicky/ [facility] [phone] fax [phone].   Follow-Up: Your physician wants you to follow-up in: AUGUST AS PLANNED WITH DR Allyson SabalBERRY You should receive a reminder letter in the mail two months in advance. If you do not receive a letter, please call our office 07-2017 to schedule the 09-2017 follow-up appointment.   [facility] you [facility]!!

## 2017-05-20 NOTE — Telephone Encounter (Signed)
Scott DeistKathryn Rush's 05/20/17 office note faxed to Dr.Joshua Rickey at fax # [phone].

## 2017-07-27 ENCOUNTER — Other Ambulatory Visit: Payer: Self-pay | Admitting: Cardiovascular Disease

## 2017-08-16 ENCOUNTER — Telehealth: Payer: Self-pay | Admitting: Cardiovascular Disease

## 2017-08-16 MED ORDER — ATORVASTATIN CALCIUM 40 MG PO TABS: 40.0000 mg | ORAL_TABLET | Freq: Every day | ORAL | 6 refills | Status: DC

## 2017-08-16 MED ORDER — CLOPIDOGREL BISULFATE 75 MG PO TABS
75.0000 mg | ORAL_TABLET | Freq: Every day | ORAL | 6 refills | Status: DC
Start: 2017-08-16 — End: 2017-09-28

## 2017-08-16 MED ORDER — EPLERENONE 25 MG PO TABS: 25.0000 mg | ORAL_TABLET | Freq: Every day | ORAL | 6 refills | Status: DC

## 2017-08-16 NOTE — Telephone Encounter (Signed)
New message    Patient requesting call to confirm refill. Going out of town. Will be out of meds on Friday     1. Which medications need to be refilled? (please list name of each medication and dose if known) eplerenone (INSPRA) 25 MG tablet, atorvastatin (LIPITOR) 40 MG tablet and clopidogrel (PLAVIX) 75 MG tablet  2. Which pharmacy/location (including street and city if local pharmacy) is medication to be sent to? [id number] - [place], [place] - [address]. Do they need a 30 day or 90 day supply?30

## 2017-08-16 NOTE — Telephone Encounter (Signed)
Rx has been sent to the pharmacy electronically. ° °

## 2017-09-14 ENCOUNTER — Ambulatory Visit: Payer: 59 | Admitting: Cardiovascular Disease

## 2017-09-28 ENCOUNTER — Ambulatory Visit: Payer: 59 | Admitting: Cardiovascular Disease

## 2017-09-28 ENCOUNTER — Encounter: Payer: Self-pay | Admitting: Cardiovascular Disease

## 2017-09-28 VITALS — BP 121/83 | HR 68 | Ht 68.5 in | Wt 212.2 lb

## 2017-09-28 DIAGNOSIS — I2510 Atherosclerotic heart disease of native coronary artery without angina pectoris: Secondary | ICD-10-CM

## 2017-09-28 MED ORDER — LISINOPRIL 2.5 MG PO TABS: 2.5000 mg | ORAL_TABLET | Freq: Every day | ORAL | 3 refills | Status: DC

## 2017-09-28 MED ORDER — CARVEDILOL 3.125 MG PO TABS: 3.1250 mg | ORAL_TABLET | Freq: Two times a day (BID) | ORAL | 3 refills | Status: DC

## 2017-09-28 MED ORDER — EPLERENONE 25 MG PO TABS
25.0000 mg | ORAL_TABLET | Freq: Every day | ORAL | 6 refills | Status: DC
Start: 2017-09-28 — End: 2018-04-28

## 2017-09-28 MED ORDER — ATORVASTATIN CALCIUM 40 MG PO TABS: 40.0000 mg | ORAL_TABLET | Freq: Every day | ORAL | 3 refills | Status: DC

## 2017-09-28 MED ORDER — NITROGLYCERIN 0.4 MG SL SUBL: 0.4000 mg | SUBLINGUAL_TABLET | SUBLINGUAL | 3 refills | Status: AC | PRN

## 2017-09-28 MED ORDER — CLOPIDOGREL BISULFATE 75 MG PO TABS: 75.0000 mg | ORAL_TABLET | Freq: Every day | ORAL | 3 refills | Status: DC

## 2017-09-28 MED ORDER — PANTOPRAZOLE SODIUM 40 MG PO TBEC: 40.0000 mg | DELAYED_RELEASE_TABLET | Freq: Every day | ORAL | 3 refills | Status: DC

## 2017-09-28 NOTE — Progress Notes (Signed)
09/28/2017 Scott RuizKevin A Grandison   08/25/1955  [id number]  Primary Physician Devra DoppHowell, Tamieka, MD Primary Cardiologist: Runell GessJonathan J Press Casale MD FACP, [place]FACC, [place]FAHA, [place]  HPI:  [name] is a 62 y.o.  mildly overweight, married Caucasian male, father of 3 living children (1 son was killed in [place] July 22, 2004, by a sniper,) grandfather to 4 grandchildren and 4 step-grandchildren who I last saw in the office 09/04/2016. He suffered an anterior wall myocardial infarction May 14, 2008, complicated by ventricular fibrillation requiring defibrillation. He had cardiogenic shock requiring intraaortic balloon pumping. He was stented with a 3.5 x 20 mm long Xience drug-eluting stent. His EF at that time was 45% with anteroapical wall motion abnormality. Since that time his diet has changed, he stopped smoking and he exercises. His EF improved and followup Myoview performed several months later showed significant salvage of myocardium with no evidence of scar. His last lipid profile performed  09/04/2016 revealed total cholesterol 142, LDL 66 and HDL 44.  Since I saw him a year ago he's remained asymptomatic. He did, interestingly, jump  out of a perfectly good plane with a parachute.  He has not jumped out of airplanes since his last visit.  He denies chest pain or shortness of breath.  He stopped smoking at the time of his heart attack.  His lipid profile is excellent.  Current Meds  Medication Sig  . acetaminophen (TYLENOL) 650 MG CR tablet Take 650 mg by mouth every 8 (eight) hours as needed for pain.  Marland Kitchen. acyclovir (ZOVIRAX) 400 MG tablet Take 400 mg by mouth 3 (three) times daily as needed.  Marland Kitchen. albuterol (PROVENTIL HFA;VENTOLIN HFA) 108 (90 BASE) MCG/ACT inhaler Inhale 2 puffs into the lungs every 6 (six) hours as needed for wheezing.  Marland Kitchen. aspirin EC 81 MG tablet Take 81 mg by mouth daily.   Marland Kitchen. atorvastatin (LIPITOR) 40 MG tablet Take 1 tablet (40 mg total) by mouth daily.  . carvedilol (COREG) 3.125 MG tablet  Take 1 tablet (3.125 mg total) by mouth 2 (two) times daily with a meal.  . cetirizine (ZYRTEC) 10 MG tablet Take 10 mg by mouth daily.  . clopidogrel (PLAVIX) 75 MG tablet Take 1 tablet (75 mg total) by mouth daily.  . Coenzyme Q10 (CO Q-10 MAXIMUM STRENGTH PO) Take 250 mg by mouth daily.  Marland Kitchen. eplerenone (INSPRA) 25 MG tablet Take 1 tablet (25 mg total) by mouth daily.  . fluticasone (FLONASE) 50 MCG/ACT nasal spray Place 1 spray into both nostrils daily.  Marland Kitchen. lisinopril (PRINIVIL,ZESTRIL) 2.5 MG tablet Take 1 tablet (2.5 mg total) by mouth daily.  . Multiple Vitamin (MULTIVITAMIN) tablet Take 1 tablet by mouth daily.  . nitroGLYCERIN (NITROSTAT) 0.4 MG SL tablet Place 1 tablet (0.4 mg total) under the tongue every 5 (five) minutes as needed for chest pain.  Marland Kitchen. oxymetazoline (AFRIN) 0.05 % nasal spray Place 1 spray into both nostrils 4 (four) times daily as needed for congestion.  . pantoprazole (PROTONIX) 40 MG tablet Take 1 tablet (40 mg total) by mouth daily.  . sertraline (ZOLOFT) 25 MG tablet Take 25 mg by mouth daily.  . [DISCONTINUED] Fluticasone Furoate 50 MCG/ACT AEPB Inhale into the lungs.     Allergies  Allergen Reactions  . Demeclocycline Hives  . Penicillins Hives and Swelling    Social History   Socioeconomic History  . Marital status: Married    Spouse name: Not on file  . Number of children: Not on file  .  Years of education: Not on file  . Highest education level: Not on file  Occupational History  . Not on file  Social Needs  . Financial resource strain: Not on file  . Food insecurity:    Worry: Not on file    Inability: Not on file  . Transportation needs:    Medical: Not on file    Non-medical: Not on file  Tobacco Use  . Smoking status: Former Smoker    Last attempt to quit: 05/23/2008    Years since quitting: 9.3  . Smokeless tobacco: Former Engineer, water and Sexual Activity  . Alcohol use: Not on file  . Drug use: Not on file  . Sexual activity: Not  on file  Lifestyle  . Physical activity:    Days per week: Not on file    Minutes per session: Not on file  . Stress: Not on file  Relationships  . Social connections:    Talks on phone: Not on file    Gets together: Not on file    Attends religious service: Not on file    Active member of club or organization: Not on file    Attends meetings of clubs or organizations: Not on file    Relationship status: Not on file  . Intimate partner violence:    Fear of current or ex partner: Not on file    Emotionally abused: Not on file    Physically abused: Not on file    Forced sexual activity: Not on file  Other Topics Concern  . Not on file  Social History Narrative  . Not on file     Review of Systems: General: negative for chills, fever, night sweats or weight changes.  Cardiovascular: negative for chest pain, dyspnea on exertion, edema, orthopnea, palpitations, paroxysmal nocturnal dyspnea or shortness of breath Dermatological: negative for rash Respiratory: negative for cough or wheezing Urologic: negative for hematuria Abdominal: negative for nausea, vomiting, diarrhea, bright red blood per rectum, melena, or hematemesis Neurologic: negative for visual changes, syncope, or dizziness All other systems reviewed and are otherwise negative except as noted above.    Blood pressure 121/83, pulse 68, height 5' 8.5" (1.74 m), weight 212 lb 3.2 oz (96.3 kg), SpO2 95 %.  General appearance: alert and no distress Neck: no adenopathy, no carotid bruit, no JVD, supple, symmetrical, trachea midline and thyroid not enlarged, symmetric, no tenderness/mass/nodules Lungs: clear to auscultation bilaterally Heart: regular rate and rhythm, S1, S2 normal, no murmur, click, rub or gallop Extremities: extremities normal, atraumatic, no cyanosis or edema Pulses: 2+ and symmetric Skin: Skin color, texture, turgor normal. No rashes or lesions Neurologic: Alert and oriented X 3, normal strength and  tone. Normal symmetric reflexes. Normal coordination and gait  EKG not performed today.  ASSESSMENT AND PLAN:   CAD - LAD DES 2010 History of CAD status post anterior STEMI 05/14/2008 complicated by ventricular fibrillation.  He is in cardiogenic shock required intra-aortic balloon pumping and stenting of his LAD with a 3.5 mm x 20 mm long Xience drug-eluting stent.  His EF at that time was 45% with an anteroapical wall motion abnormality.  His EF subtotally improved by Myoview and echo.  He denies chest pain or shortness of breath.  Hyperlipidemia LDL goal <70 History of hyperlipidemia on statin therapy with lipid profile performed 09/04/2016 revealing a total cholesterol of 42, LDL 66 and HDL of 44.  HTN (hypertension) History of essential hypertension her blood pressure measured at 121/83.  He is on carvedilol and lisinopril.  Continue current meds at current dosing.      Runell Gess MD FACP,FACC,FAHA, [facility] 09/28/2017 11:31 AM

## 2017-09-28 NOTE — Assessment & Plan Note (Signed)
History of hyperlipidemia on statin therapy with lipid profile performed 09/04/2016 revealing a total cholesterol of 42, LDL 66 and HDL of 44.

## 2017-09-28 NOTE — Patient Instructions (Signed)
Your physician wants you to follow-up in: ONE YEAR WITH DR BERRY You will receive a reminder letter in the mail two months in advance. If you don't receive a letter, please call our office to schedule the follow-up appointment.   If you need a refill on your cardiac medications before your next appointment, please call your pharmacy.  

## 2017-09-28 NOTE — Assessment & Plan Note (Signed)
History of CAD status post anterior STEMI 05/14/2008 complicated by ventricular fibrillation.  He is in cardiogenic shock required intra-aortic balloon pumping and stenting of his LAD with a 3.5 mm x 20 mm long Xience drug-eluting stent.  His EF at that time was 45% with an anteroapical wall motion abnormality.  His EF subtotally improved by Myoview and echo.  He denies chest pain or shortness of breath.

## 2017-09-28 NOTE — Assessment & Plan Note (Signed)
History of essential hypertension her blood pressure measured at 121/83.  He is on carvedilol and lisinopril.  Continue current meds at current dosing.

## 2017-12-28 ENCOUNTER — Encounter: Payer: Self-pay | Admitting: Cardiovascular Disease

## 2017-12-28 ENCOUNTER — Telehealth: Payer: Self-pay | Admitting: Cardiovascular Disease

## 2017-12-28 ENCOUNTER — Ambulatory Visit: Payer: 59 | Admitting: Cardiovascular Disease

## 2017-12-28 VITALS — BP 122/78 | HR 65 | Ht 69.0 in | Wt 209.2 lb

## 2017-12-28 DIAGNOSIS — R002 Palpitations: Secondary | ICD-10-CM | POA: Insufficient documentation

## 2017-12-28 NOTE — Progress Notes (Signed)
12/28/2017 Scott RuizKevin A Rush   08/23/1955  [id number]  Primary Physician Devra DoppHowell, Tamieka, MD Primary Cardiologist: Runell GessJonathan J Braxtyn Bojarski MD FACP, [place]ACC, [place]HA, [place]  HPI:  Scott RuizKevin A Rush is a 62 y.o.  mildly overweight, married Caucasian male, father of 3 living children (1 son was killed in [place] July 22, 2004, by a sniper,) grandfather to 4 grandchildren and 4 step-grandchildren who I last saw in the office  09/28/2017. He suffered an anterior wall myocardial infarction May 14, 2008, complicated by ventricular fibrillation requiring defibrillation. He had cardiogenic shock requiring intraaortic balloon pumping. He was stented with a 3.5 x 20 mm long Xience drug-eluting stent. His EF at that time was 45% with anteroapical wall motion abnormality. Since that time his diet has changed, he stopped smoking and he exercises. His EF improved and followup Myoview performed several months later showed significant salvage of myocardium with no evidence of scar. His last lipid profile performed 09/04/2016 revealed total cholesterol 142, LDL 66 and HDL 44.  Since I saw him a year ago he's remained asymptomatic. He did, interestingly,jumpout of a perfectly good plane with a parachute. Since I saw [name] 4 months ago he has been doing well except for new onset palpitations which she both feels and sees on his home blood pressure cuff.  He otherwise is stable and denies chest pain or shortness of breath.   Current Meds  Medication Sig  . acetaminophen (TYLENOL) 650 MG CR tablet Take 650 mg by mouth every 8 (eight) hours as needed for pain.  Marland Kitchen. acyclovir (ZOVIRAX) 400 MG tablet Take 400 mg by mouth 3 (three) times daily as needed.  Marland Kitchen. albuterol (PROVENTIL HFA;VENTOLIN HFA) 108 (90 BASE) MCG/ACT inhaler Inhale 2 puffs into the lungs every 6 (six) hours as needed for wheezing.  Marland Kitchen. aspirin EC 81 MG tablet Take 81 mg by mouth daily.   Marland Kitchen. atorvastatin (LIPITOR) 40 MG tablet Take 1 tablet (40 mg total) by mouth daily.    . carvedilol (COREG) 3.125 MG tablet Take 1 tablet (3.125 mg total) by mouth 2 (two) times daily with a meal.  . cetirizine (ZYRTEC) 10 MG tablet Take 10 mg by mouth daily.  . clopidogrel (PLAVIX) 75 MG tablet Take 1 tablet (75 mg total) by mouth daily.  . Coenzyme Q10 (CO Q-10 MAXIMUM STRENGTH PO) Take 250 mg by mouth daily.  Marland Kitchen. eplerenone (INSPRA) 25 MG tablet Take 1 tablet (25 mg total) by mouth daily.  . fluticasone (FLONASE) 50 MCG/ACT nasal spray Place 1 spray into both nostrils daily.  Marland Kitchen. lisinopril (PRINIVIL,ZESTRIL) 2.5 MG tablet Take 1 tablet (2.5 mg total) by mouth daily.  . Multiple Vitamin (MULTIVITAMIN) tablet Take 1 tablet by mouth daily.  . nitroGLYCERIN (NITROSTAT) 0.4 MG SL tablet Place 1 tablet (0.4 mg total) under the tongue every 5 (five) minutes as needed for chest pain.  Marland Kitchen. oxymetazoline (AFRIN) 0.05 % nasal spray Place 1 spray into both nostrils 4 (four) times daily as needed for congestion.  . pantoprazole (PROTONIX) 40 MG tablet Take 1 tablet (40 mg total) by mouth daily.  . sertraline (ZOLOFT) 50 MG tablet Take 1 tablet by mouth daily.     Allergies  Allergen Reactions  . Demeclocycline Hives  . Penicillins Hives and Swelling    Social History   Socioeconomic History  . Marital status: Married    Spouse name: Not on file  . Number of children: Not on file  . Years of education: Not on file  .  Highest education level: Not on file  Occupational History  . Not on file  Social Needs  . Financial resource strain: Not on file  . Food insecurity:    Worry: Not on file    Inability: Not on file  . Transportation needs:    Medical: Not on file    Non-medical: Not on file  Tobacco Use  . Smoking status: Former Smoker    Last attempt to quit: 05/23/2008    Years since quitting: 9.6  . Smokeless tobacco: Former Engineer, water and Sexual Activity  . Alcohol use: Not on file  . Drug use: Not on file  . Sexual activity: Not on file  Lifestyle  . Physical  activity:    Days per week: Not on file    Minutes per session: Not on file  . Stress: Not on file  Relationships  . Social connections:    Talks on phone: Not on file    Gets together: Not on file    Attends religious service: Not on file    Active member of club or organization: Not on file    Attends meetings of clubs or organizations: Not on file    Relationship status: Not on file  . Intimate partner violence:    Fear of current or ex partner: Not on file    Emotionally abused: Not on file    Physically abused: Not on file    Forced sexual activity: Not on file  Other Topics Concern  . Not on file  Social History Narrative  . Not on file     Review of Systems: General: negative for chills, fever, night sweats or weight changes.  Cardiovascular: negative for chest pain, dyspnea on exertion, edema, orthopnea, palpitations, paroxysmal nocturnal dyspnea or shortness of breath Dermatological: negative for rash Respiratory: negative for cough or wheezing Urologic: negative for hematuria Abdominal: negative for nausea, vomiting, diarrhea, bright red blood per rectum, melena, or hematemesis Neurologic: negative for visual changes, syncope, or dizziness All other systems reviewed and are otherwise negative except as noted above.    Blood pressure 122/78, pulse 65, height 5\' 9"  (1.753 m), weight 209 lb 3.2 oz (94.9 kg).  General appearance: alert and no distress Neck: no adenopathy, no carotid bruit, no JVD, supple, symmetrical, trachea midline and thyroid not enlarged, symmetric, no tenderness/mass/nodules Lungs: clear to auscultation bilaterally Heart: regular rate and rhythm, S1, S2 normal, no murmur, click, rub or gallop Extremities: extremities normal, atraumatic, no cyanosis or edema Pulses: 2+ and symmetric Skin: Skin color, texture, turgor normal. No rashes or lesions Neurologic: Alert and oriented X 3, normal strength and tone. Normal symmetric reflexes. Normal  coordination and gait  EKG sinus rhythm 65 without ST or T wave changes.  Personally reviewed this EKG.  ASSESSMENT AND PLAN:   Palpitations Since I saw [name] 4 months ago he developed palpitations which occur several times a week.  No other symptoms.  I am going to put him on a ZIO patch to further evaluate.      Runell Gess MD FACP,FACC,FAHA, [facility] 12/28/2017 12:06 PM

## 2017-12-28 NOTE — Assessment & Plan Note (Signed)
Since I saw [name] 4 months ago he developed palpitations which occur several times a week.  No other symptoms.  I am going to put him on a ZIO patch to further evaluate.

## 2017-12-28 NOTE — Telephone Encounter (Signed)
New Message   Pt c/o BP issue:  1. What are your last 5 BP readings? 124/82 HR 78 2. Are you having any other symptoms (ex. Dizziness, headache, blurred vision, passed out)? No symptoms 3. What is your medication issue?   Patient states that he has a monitor cuff that tells him that his HR is abnormal.

## 2017-12-28 NOTE — Telephone Encounter (Signed)
Pt called to report that he has been having problems with possible irregular heart beat. He says that his cuff will give him a BP which has been good at 120/80 but cannot calculate a HR. He feels like his heart is "skipping" a beat every third beat. He says it has been going on for about 2 weeks.  He denies dizziness, sob, chest pain... But reports that he feels like something is off and would like to be seen Offered pt an appt for today at 11:30 and with Dr. Allyson SabalBerry and pt agreed.

## 2017-12-28 NOTE — Patient Instructions (Signed)
Medication Instructions:  NONE If you need a refill on your cardiac medications before your next appointment, please call your pharmacy.   Lab work: NONE If you have labs (blood work) drawn today and your tests are completely normal, you will receive your results only by: Marland Kitchen. MyChart Message (if you have MyChart) OR . A paper copy in the mail If you have any lab test that is abnormal or we need to change your treatment, we will call you to review the results.  Testing/Procedures: Your physician has recommended that you wear a ZIO event monitor. Event monitors are medical devices that record the heart's electrical activity. Doctors most often us these monitors to diagnose arrhythmias. Arrhythmias are problems with the speed or rhythm of the heartbeat. The monitor is a small, portable device. You can wear one while you do your normal daily activities. This is usually used to diagnose what is causing palpitations/syncope (passing out).    Follow-Up: At [facility], you and your health needs are our priority.  As part of our continuing mission to provide you with exceptional heart care, we have created designated Provider Care Teams.  These Care Teams include your primary Cardiologist (physician) and Advanced Practice Providers (APPs -  Physician Assistants and Nurse Practitioners) who all work together to provide you with the care you need, when you need it. You will need a follow up appointment WITH AN APP in 6 months and Dr. Allyson SabalBerry in 12 months.  Please call our office 2 months in advance to schedule this appointment.  Corine ShelterLuke Kilroy, PA-C [place]rista Kroeger, [place] JerseyPA-C . Marjie Skiffallie Goodrich, PA-C

## 2018-01-11 ENCOUNTER — Ambulatory Visit (INDEPENDENT_AMBULATORY_CARE_PROVIDER_SITE_OTHER): Payer: 59

## 2018-01-11 DIAGNOSIS — R002 Palpitations: Secondary | ICD-10-CM | POA: Diagnosis not present

## 2018-02-15 ENCOUNTER — Telehealth: Payer: Self-pay | Admitting: *Deleted

## 2018-02-15 NOTE — Telephone Encounter (Signed)
   [facility] Pre-operative Risk Assessment    Request for surgical clearance:  1. What type of surgery is being performed? TRANSRECTAL ULTRASOUND GUIDED PROSTATE BIOPSY    2. When is this surgery scheduled? 02/22/18    3. What type of clearance is required (medical clearance vs. Pharmacy clearance to hold med vs. Both)?   4. Are there any medications that need to be held prior to surgery and how long?ASA AND PLAVIX    5. Practice name and name of physician performing surgery?[facility]     6. What is your office phone number? [phone]     7.   What is your office fax number NOT LISTED  8.   Anesthesia type (None, local, MAC, general) ? UNKNOWN

## 2018-02-16 NOTE — Telephone Encounter (Signed)
   Primary Cardiologist: Nanetta Batty, MD  Chart reviewed as part of pre-operative protocol coverage. Left voice mail to call back between pre-op hours on cell phone.   Dr. Allyson Sabal, Can patient hold both for 5 days?  [place], [name] 02/16/2018, 1:44 PM

## 2018-02-16 NOTE — Telephone Encounter (Signed)
Patient returned call. Given past medical history and time since last visit, based on ACC/AHA guidelines, [name] would be at acceptable risk for the planned procedure without further cardiovascular testing. Getting > 4 mets of activity.   I will route this recommendation to the requesting party via Epic fax function and remove from pre-op pool.  Please call with questions.  [name], [name] 02/16/2018, 2:08 PM

## 2018-02-16 NOTE — Telephone Encounter (Signed)
CALLED CLINIC MULTIPLE TIMES TO  GET FAX NUMBER ON HOLD 20 MINS.  WENT TO WEBSITE UNABLE TO LOCATE NUMBER ON WEBSITE AS TOLD ON VM

## 2018-02-16 NOTE — Telephone Encounter (Signed)
Patient called today upset and yelling that he has not heard if he needs to stop taking his medications. Please call patient.

## 2018-02-16 NOTE — Telephone Encounter (Signed)
Okay to interrupt his antiplatelet therapy for his prostate biopsy. 

## 2018-02-18 NOTE — Telephone Encounter (Signed)
CLINIC ANSWERED PHONE AND FAX NUMBER OBTAINED [phone] AND FAXED FROM [phone]

## 2018-04-28 ENCOUNTER — Other Ambulatory Visit: Payer: Self-pay | Admitting: Cardiovascular Disease

## 2018-09-06 ENCOUNTER — Telehealth: Payer: Self-pay

## 2018-09-06 NOTE — Telephone Encounter (Signed)
Spoke to pt in attempt to move office visit with Dr. Gwenlyn Found on 8/7 to a virtual visit on Wednesday 8/5. Pt stated he does not want to do that. He wants to come in and have an EKG and have his heart checked like he does every year. Informed pt we will keep his appt the same. Pt verbalized thanks.

## 2018-09-09 ENCOUNTER — Other Ambulatory Visit: Payer: Self-pay

## 2018-09-09 ENCOUNTER — Encounter: Payer: Self-pay | Admitting: Cardiovascular Disease

## 2018-09-09 ENCOUNTER — Ambulatory Visit: Payer: 59 | Admitting: Cardiovascular Disease

## 2018-09-09 VITALS — BP 132/84 | HR 67 | Temp 97.3°F | Ht 68.5 in | Wt 212.0 lb

## 2018-09-09 DIAGNOSIS — I2510 Atherosclerotic heart disease of native coronary artery without angina pectoris: Secondary | ICD-10-CM

## 2018-09-09 DIAGNOSIS — E785 Hyperlipidemia, unspecified: Secondary | ICD-10-CM

## 2018-09-09 DIAGNOSIS — E7800 Pure hypercholesterolemia, unspecified: Secondary | ICD-10-CM

## 2018-09-09 DIAGNOSIS — I10 Essential (primary) hypertension: Secondary | ICD-10-CM

## 2018-09-09 DIAGNOSIS — Z0189 Encounter for other specified special examinations: Secondary | ICD-10-CM

## 2018-09-09 LAB — HEPATIC FUNCTION PANEL
ALT: 40 IU/L (ref 0–44)
AST: 26 IU/L (ref 0–40)
Albumin: 4.8 g/dL (ref 3.8–4.8)
Alkaline Phosphatase: 101 IU/L (ref 39–117)
Bilirubin Total: 0.5 mg/dL (ref 0.0–1.2)
Bilirubin, Direct: 0.15 mg/dL (ref 0.00–0.40)
Total Protein: 6.9 g/dL (ref 6.0–8.5)

## 2018-09-09 LAB — LIPID PANEL
Chol/HDL Ratio: 3.3 ratio (ref 0.0–5.0)
Cholesterol, Total: 144 mg/dL (ref 100–199)
HDL: 43 mg/dL (ref >39)
LDL Calculated: 73 mg/dL (ref 0–99)
Triglycerides: 142 mg/dL (ref 0–149)
VLDL Cholesterol Cal: 28 mg/dL (ref 5–40)

## 2018-09-09 NOTE — Progress Notes (Signed)
09/09/2018 [name]   08-Mar-1955  [id number]  Primary Physician Helane Rima, MD Primary Cardiologist: Lorretta Harp MD FACP, [place], [name], [name]  HPI:  [name] is a 63 y.o.  mildly overweight, married Caucasian male, father of 3 living children (1 son was killed in [place] July 22, 2004, by a sniper,) grandfather to 4 grandchildren and 4 step-grandchildren who I last saw in the office  12/28/2017. He suffered an anterior wall myocardial infarction May 14, 8097, complicated by ventricular fibrillation requiring defibrillation. He had cardiogenic shock requiring intraaortic balloon pumping. He was stented with a 3.5 x 20 mm long Xience drug-eluting stent. His EF at that time was 45% with anteroapical wall motion abnormality. Since that time his diet has changed, he stopped smoking and he exercises. His EF improved and followup Myoview performed several months later showed significant salvage of myocardium with no evidence of scar. His last lipid profile performed8/3/2018revealed total cholesterol 142, LDL 66 and HDL 44.Since I saw him a year ago he's remained asymptomatic. He did, interestingly,jumpout of a perfectly good plane with a parachute. Since I saw [name] 12 months ago he continues to do well.  He denies chest pain or shortness of breath.   Current Meds  Medication Sig  . acetaminophen (TYLENOL) 650 MG CR tablet Take 650 mg by mouth every 8 (eight) hours as needed for pain.  Marland Kitchen acyclovir (ZOVIRAX) 400 MG tablet Take 400 mg by mouth 3 (three) times daily as needed.  Marland Kitchen albuterol (PROVENTIL HFA;VENTOLIN HFA) 108 (90 BASE) MCG/ACT inhaler Inhale 2 puffs into the lungs every 6 (six) hours as needed for wheezing.  Marland Kitchen aspirin EC 81 MG tablet Take 81 mg by mouth daily.   Marland Kitchen atorvastatin (LIPITOR) 40 MG tablet Take 1 tablet (40 mg total) by mouth daily.  . carvedilol (COREG) 3.125 MG tablet Take 1 tablet (3.125 mg total) by mouth 2 (two) times daily with a meal.  .  cetirizine (ZYRTEC) 10 MG tablet Take 10 mg by mouth daily.  . clopidogrel (PLAVIX) 75 MG tablet Take 1 tablet (75 mg total) by mouth daily.  . Coenzyme Q10 (CO Q-10 MAXIMUM STRENGTH PO) Take 250 mg by mouth daily.  . fluticasone (FLONASE) 50 MCG/ACT nasal spray Place 1 spray into both nostrils daily.  Marland Kitchen lisinopril (PRINIVIL,ZESTRIL) 2.5 MG tablet Take 1 tablet (2.5 mg total) by mouth daily.  . Multiple Vitamin (MULTIVITAMIN) tablet Take 1 tablet by mouth daily.  . nitroGLYCERIN (NITROSTAT) 0.4 MG SL tablet Place 1 tablet (0.4 mg total) under the tongue every 5 (five) minutes as needed for chest pain.  Marland Kitchen oxymetazoline (AFRIN) 0.05 % nasal spray Place 1 spray into both nostrils 4 (four) times daily as needed for congestion.  . pantoprazole (PROTONIX) 40 MG tablet Take 1 tablet (40 mg total) by mouth daily.  . sertraline (ZOLOFT) 50 MG tablet Take 1 tablet by mouth daily.     Allergies  Allergen Reactions  . Demeclocycline Hives  . Penicillins Hives and Swelling    Social History   Socioeconomic History  . Marital status: Married    Spouse name: Not on file  . Number of children: Not on file  . Years of education: Not on file  . Highest education level: Not on file  Occupational History  . Not on file  Social Needs  . Financial resource strain: Not on file  . Food insecurity    Worry: Not on file    Inability:  Not on file  . Transportation needs    Medical: Not on file    Non-medical: Not on file  Tobacco Use  . Smoking status: Former Smoker    Quit date: 05/23/2008    Years since quitting: 10.3  . Smokeless tobacco: Former Engineer, waterUser  Substance and Sexual Activity  . Alcohol use: Not on file  . Drug use: Not on file  . Sexual activity: Not on file  Lifestyle  . Physical activity    Days per week: Not on file    Minutes per session: Not on file  . Stress: Not on file  Relationships  . Social Musicianconnections    Talks on phone: Not on file    Gets together: Not on file     Attends religious service: Not on file    Active member of club or organization: Not on file    Attends meetings of clubs or organizations: Not on file    Relationship status: Not on file  . Intimate partner violence    Fear of current or ex partner: Not on file    Emotionally abused: Not on file    Physically abused: Not on file    Forced sexual activity: Not on file  Other Topics Concern  . Not on file  Social History Narrative  . Not on file     Review of Systems: General: negative for chills, fever, night sweats or weight changes.  Cardiovascular: negative for chest pain, dyspnea on exertion, edema, orthopnea, palpitations, paroxysmal nocturnal dyspnea or shortness of breath Dermatological: negative for rash Respiratory: negative for cough or wheezing Urologic: negative for hematuria Abdominal: negative for nausea, vomiting, diarrhea, bright red blood per rectum, melena, or hematemesis Neurologic: negative for visual changes, syncope, or dizziness All other systems reviewed and are otherwise negative except as noted above.    Blood pressure 132/84, pulse 67, temperature (!) 97.3 F (36.3 C), height 5' 8.5" (1.74 m), weight 212 lb (96.2 kg).  General appearance: alert and no distress Neck: no adenopathy, no carotid bruit, no JVD, supple, symmetrical, trachea midline and thyroid not enlarged, symmetric, no tenderness/mass/nodules Lungs: clear to auscultation bilaterally Heart: regular rate and rhythm, S1, S2 normal, no murmur, click, rub or gallop Extremities: extremities normal, atraumatic, no cyanosis or edema Pulses: 2+ and symmetric Skin: Skin color, texture, turgor normal. No rashes or lesions Neurologic: Alert and oriented X 3, normal strength and tone. Normal symmetric reflexes. Normal coordination and gait  EKG sinus rhythm at 67 with occasional PVCs.  I personally reviewed this EKG.  ASSESSMENT AND PLAN:   CAD - LAD DES 2010 History of CAD status post anterior MI  05/14/2008 complicated by VF arrest requiring defibrillation, cardiogenic shock requiring intra-aortic balloon pump insertion and stenting of his LAD using a 3.5 x 20 mm long Xience drug-eluting stent.  His EF initially was 45% with an anteroapical wall motion normality which improved to normal and a subsequent Myoview stress test performed several months later showed complete salvage of myocardium without evidence of scar.  He denies chest pain or shortness of breath.  Hyperlipidemia LDL goal <70 History of hyperlipidemia on statin therapy with lipid profile performed 09/04/2016 revealing cholesterol 142, LDL 66 and HDL 44.  We will repeat a lipid liver profile.  HTN (hypertension) History of essential hypertension with blood pressure measured today 132/84.  He is on carvedilol and lisinopril.      Runell GessJonathan J. Jerilee Space MD FACP,FACC,FAHA, [place] [facility] 09/09/2018 9:49 AM

## 2018-09-09 NOTE — Assessment & Plan Note (Signed)
History of hyperlipidemia on statin therapy with lipid profile performed 09/04/2016 revealing cholesterol 142, LDL 66 and HDL 44.  We will repeat a lipid liver profile.

## 2018-09-09 NOTE — Patient Instructions (Signed)
Medication Instructions:  Your physician recommends that you continue on your current medications as directed. Please refer to the Current Medication list given to you today.  If you need a refill on your cardiac medications before your next appointment, please call your pharmacy.   Lab work: Your physician recommends that you have lab work done today: LIPID AND LIVER PANELS  If you have labs (blood work) drawn today and your tests are completely normal, you will receive your results only by: Marland Kitchen MyChart Message (if you have MyChart) OR . A paper copy in the mail If you have any lab test that is abnormal or we need to change your treatment, we will call you to review the results.  Testing/Procedures: NONE  Follow-Up: At [facility], you and your health needs are our priority.  As part of our continuing mission to provide you with exceptional heart care, we have created designated Provider Care Teams.  These Care Teams include your primary Cardiologist (physician) and Advanced Practice Providers (APPs -  Physician Assistants and Nurse Practitioners) who all work together to provide you with the care you need, when you need it. You will need a follow up appointment in 12 months WITH DR. Gwenlyn Found.  Please call our office 2 months in advance to schedule this appointment.

## 2018-09-09 NOTE — Assessment & Plan Note (Signed)
History of CAD status post anterior MI 08/21/9468 complicated by VF arrest requiring defibrillation, cardiogenic shock requiring intra-aortic balloon pump insertion and stenting of his LAD using a 3.5 x 20 mm long Xience drug-eluting stent.  His EF initially was 45% with an anteroapical wall motion normality which improved to normal and a subsequent Myoview stress test performed several months later showed complete salvage of myocardium without evidence of scar.  He denies chest pain or shortness of breath.

## 2018-09-09 NOTE — Assessment & Plan Note (Signed)
History of essential hypertension with blood pressure measured today 132/84.  He is on carvedilol and lisinopril.

## 2018-09-09 NOTE — Progress Notes (Signed)
Dr. Gwenlyn Found requested on 8/7 that this patient have TSH, BMP, HEMOGLOBIN A1C, PSA, AND CBC LAB ORDERED. LAB SLIP AND LETTER MAILED TO PT ON 8/7

## 2018-09-20 LAB — BASIC METABOLIC PANEL
BUN/Creatinine Ratio: 14 (ref 10–24)
BUN: 14 mg/dL (ref 8–27)
CO2: 24 mmol/L (ref 20–29)
Calcium: 10.4 mg/dL — ABNORMAL HIGH (ref 8.6–10.2)
Chloride: 101 mmol/L (ref 96–106)
Creatinine, Ser: 0.99 mg/dL (ref 0.76–1.27)
GFR calc Af Amer: 94 mL/min/{1.73_m2} (ref >59)
GFR calc non Af Amer: 81 mL/min/{1.73_m2} (ref >59)
Glucose: 109 mg/dL — ABNORMAL HIGH (ref 65–99)
Potassium: 4.6 mmol/L (ref 3.5–5.2)
Sodium: 141 mmol/L (ref 134–144)

## 2018-09-20 LAB — TSH+FREE T4
Free T4: 1.11 ng/dL (ref 0.82–1.77)
TSH: 2.62 u[IU]/mL (ref 0.450–4.500)

## 2018-09-20 LAB — CBC
Hematocrit: 43.9 % (ref 37.5–51.0)
Hemoglobin: 15.1 g/dL (ref 13.0–17.7)
MCH: 30.7 pg (ref 26.6–33.0)
MCHC: 34.4 g/dL (ref 31.5–35.7)
MCV: 89 fL (ref 79–97)
Platelets: 256 10*3/uL (ref 150–450)
RBC: 4.92 x10E6/uL (ref 4.14–5.80)
RDW: 11.8 % (ref 11.6–15.4)
WBC: 7.3 10*3/uL (ref 3.4–10.8)

## 2018-09-20 LAB — HEMOGLOBIN A1C
Est. average glucose Bld gHb Est-mCnc: 117 mg/dL
Hgb A1c MFr Bld: 5.7 % — ABNORMAL HIGH (ref 4.8–5.6)

## 2018-09-20 LAB — PSA: Prostate Specific Ag, Serum: 4.8 ng/mL — ABNORMAL HIGH (ref 0.0–4.0)

## 2018-10-30 ENCOUNTER — Other Ambulatory Visit: Payer: Self-pay | Admitting: Cardiovascular Disease
